# Patient Record
Sex: Female | Born: 1950 | Hispanic: No | Marital: Married
Health system: Northeastern US, Academic
[De-identification: ages and names within clinical notes are randomized; demographics above are authoritative.]

---

## 2020-08-24 ENCOUNTER — Encounter: Admit: 2020-08-24 | Payer: PRIVATE HEALTH INSURANCE | Attending: Adult Health | Primary: Internal Medicine

## 2020-08-24 DIAGNOSIS — Z01818 Encounter for other preprocedural examination: Secondary | ICD-10-CM

## 2020-09-04 ENCOUNTER — Inpatient Hospital Stay: Admit: 2020-09-04 | Discharge: 2020-09-04 | Payer: MEDICARE | Primary: Internal Medicine

## 2020-09-04 DIAGNOSIS — Z01818 Encounter for other preprocedural examination: Secondary | ICD-10-CM

## 2020-09-05 DIAGNOSIS — Z20822 Contact with and (suspected) exposure to covid-19: Secondary | ICD-10-CM

## 2020-09-05 DIAGNOSIS — Z01812 Encounter for preprocedural laboratory examination: Secondary | ICD-10-CM

## 2020-09-05 LAB — COVID-19 CLEARANCE OR FOR PLACEMENT ONLY: BKR SARS-COV-2 RNA (COVID-19) (YH): NEGATIVE

## 2020-09-07 ENCOUNTER — Ambulatory Visit: Admit: 2020-09-07 | Payer: PRIVATE HEALTH INSURANCE | Attending: Anesthesiology | Primary: Internal Medicine

## 2020-09-07 ENCOUNTER — Encounter: Admit: 2020-09-07 | Payer: PRIVATE HEALTH INSURANCE | Attending: Gastroenterology | Primary: Internal Medicine

## 2020-09-07 ENCOUNTER — Inpatient Hospital Stay: Admit: 2020-09-07 | Discharge: 2020-09-07 | Payer: MEDICARE | Primary: Internal Medicine

## 2020-09-07 DIAGNOSIS — Z9109 Other allergy status, other than to drugs and biological substances: Secondary | ICD-10-CM

## 2020-09-07 DIAGNOSIS — E892 Postprocedural hypoparathyroidism: Secondary | ICD-10-CM

## 2020-09-07 DIAGNOSIS — Z87891 Personal history of nicotine dependence: Secondary | ICD-10-CM

## 2020-09-07 DIAGNOSIS — K219 Gastro-esophageal reflux disease without esophagitis: Secondary | ICD-10-CM

## 2020-09-07 DIAGNOSIS — Z885 Allergy status to narcotic agent status: Secondary | ICD-10-CM

## 2020-09-07 DIAGNOSIS — Z880 Allergy status to penicillin: Secondary | ICD-10-CM

## 2020-09-07 DIAGNOSIS — Z79890 Hormone replacement therapy (postmenopausal): Secondary | ICD-10-CM

## 2020-09-07 DIAGNOSIS — Z882 Allergy status to sulfonamides status: Secondary | ICD-10-CM

## 2020-09-07 DIAGNOSIS — Z79899 Other long term (current) drug therapy: Secondary | ICD-10-CM

## 2020-09-07 DIAGNOSIS — K5900 Constipation, unspecified: Secondary | ICD-10-CM

## 2020-09-07 DIAGNOSIS — K6389 Other specified diseases of intestine: Secondary | ICD-10-CM

## 2020-09-07 DIAGNOSIS — K648 Other hemorrhoids: Secondary | ICD-10-CM

## 2020-09-07 DIAGNOSIS — Z9889 Other specified postprocedural states: Secondary | ICD-10-CM

## 2020-09-07 DIAGNOSIS — J302 Other seasonal allergic rhinitis: Secondary | ICD-10-CM

## 2020-09-07 DIAGNOSIS — M8580 Other specified disorders of bone density and structure, unspecified site: Secondary | ICD-10-CM

## 2020-09-07 DIAGNOSIS — F419 Anxiety disorder, unspecified: Secondary | ICD-10-CM

## 2020-09-07 DIAGNOSIS — E039 Hypothyroidism, unspecified: Secondary | ICD-10-CM

## 2020-09-07 DIAGNOSIS — Z881 Allergy status to other antibiotic agents status: Secondary | ICD-10-CM

## 2020-09-07 DIAGNOSIS — E210 Primary hyperparathyroidism: Secondary | ICD-10-CM

## 2020-09-07 MED ORDER — LEVOTHYROXINE 75 MCG TABLET
75 MCG | Freq: Every day | ORAL | Status: AC
Start: 2020-09-07 — End: ?

## 2020-09-07 MED ORDER — SODIUM CHLORIDE 0.9 % (FLUSH) INJECTION SYRINGE
0.9 % | Freq: Three times a day (TID) | INTRAVENOUS | Status: DC
Start: 2020-09-07 — End: 2020-09-07

## 2020-09-07 MED ORDER — SODIUM CHLORIDE 0.9 % (FLUSH) INJECTION SYRINGE
0.9 % | Status: DC | PRN
Start: 2020-09-07 — End: 2020-09-07

## 2020-09-07 MED ORDER — LACTATED RINGERS INTRAVENOUS SOLUTION
INTRAVENOUS | Status: DC
Start: 2020-09-07 — End: 2020-09-07
  Administered 2020-09-07: 13:00:00 1000.000 mL/h via INTRAVENOUS

## 2020-09-07 MED ORDER — DULOXETINE 30 MG CAPSULE,DELAYED RELEASE
30 mg | Freq: Every day | ORAL | Status: AC
Start: 2020-09-07 — End: ?

## 2020-09-07 MED ORDER — LACTATED RINGERS INTRAVENOUS SOLUTION
INTRAVENOUS | Status: DC | PRN
Start: 2020-09-07 — End: 2020-09-07
  Administered 2020-09-07: 13:00:00 via INTRAVENOUS

## 2020-09-07 MED ORDER — PROPOFOL 10 MG/ML INTRAVENOUS EMULSION
10 mg/mL | INTRAVENOUS | Status: DC | PRN
Start: 2020-09-07 — End: 2020-09-07
  Administered 2020-09-07 (×2): 10 mg/mL via INTRAVENOUS

## 2020-09-07 MED ORDER — GLYCOPYRROLATE 0.2 MG/ML INJECTION SOLUTION
0.2 mg/mL | INTRAVENOUS | Status: DC | PRN
Start: 2020-09-07 — End: 2020-09-07
  Administered 2020-09-07: 13:00:00 0.2 mg/mL via INTRAVENOUS

## 2020-09-07 MED ORDER — PROPOFOL 10 MG/ML INTRAVENOUS EMULSION
10 mg/mL | INTRAVENOUS | Status: DC | PRN
Start: 2020-09-07 — End: 2020-09-07
  Administered 2020-09-07: 13:00:00 10 mL/h via INTRAVENOUS

## 2020-09-07 MED ORDER — SODIUM CHLORIDE 0.9 % (FLUSH) INJECTION SYRINGE
0.9 % | INTRAVENOUS | Status: DC | PRN
Start: 2020-09-07 — End: 2020-09-07

## 2020-09-07 NOTE — Other
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

## 2020-09-07 NOTE — H&P
[facility]	 [facility]	 Gastroenterology History & PhysicalHistory provided by: the patientHistory limited by: no limitationsSubjective: Chief Complaint:Change in bowel habitsMedical History: PMH PSH Past Medical History: Diagnosis Date ? Anxiety  ? H/O colonoscopy 02/22/2017  5 yr recall ? Hypothyroidism  ? Osteopenia  ? Primary hyperparathyroidism (HC Code) (HC CODE)  ? Seasonal allergies   Past Surgical History: Procedure Laterality Date ? History of adenoidectomy   ? History of hernia repair   ? History of thyroid surgery   ? History of tonsillectomy   ? HYSTERECTOMY  2008  Dr. Rosanne Sack for fibroids, b/l oophrectomy as well ? PARATHYROID GLAND SURGERY  2001  Dr. Hessie Diener, [place]  Social History Family History Social History Tobacco Use ? Smoking status: Former Smoker   Packs/day: 1.00   Years: 30.00   Pack years: 30.00   Quit date: 2008   Years since quitting: 13.9 ? Smokeless tobacco: Never Used Substance Use Topics ? Alcohol use: Yes   Comment: social  Family History Problem Relation Age of Onset ? Thyroid cancer Mother  ? Osteoporosis Mother  ? GER disease Mother  ? Ulcers Mother  ? Lung cancer Mother  ? Coronary Artery Disease Father  ? Heart failure Father  ? Kidney failure Father  ? AVM Sister   Prior to Admission Medications Medications Prior to Admission Medication Sig Dispense Refill Last Dose ? calcium-magnesium-zinc 333-133-5 mg Tab Take 333 mg by mouth daily.   Past Month at Unknown time ? levothyroxine (SYNTHROID, LEVOTHROID) 75 MCG tablet Take 75 mcg by mouth daily.    ? loratadine (CLARITIN) 10 mg tablet Take 10 mg by mouth daily.   09/06/2020 at Unknown time ? ascorbic acid, vitamin C, (VITAMIN C) 1000 MG tablet Take 1,000 mg by mouth daily..   Unknown at Unknown time ? azelastine (ASTELIN) 137 mcg (0.1 %) nasal spray 1 spray by Nasal route 2 (two) times daily.. Use in each nostril as directed 30 mL 12 Unknown at Unknown time ? azelastine-fluticasone (DYMISTA) 137-50 mcg/spray Spry nasal spray 1 spray by Each Nare route 2 (two) times daily.. (Patient not taking: Reported on 09/07/2020) 23 g 0 Not Taking at Unknown time ? b complex vitamins capsule Take 1 capsule by mouth daily.    ? cholecalciferol (VITAMIN D-3) 2,000 unit capsule Take by mouth. (Patient not taking: Reported on 09/07/2020)   Not Taking at Unknown time ? cyanocobalamin (VITAMIN B-12) 1000 MCG tablet Take 1,000 mcg by mouth daily.. (Patient not taking: Reported on 09/07/2020)   Not Taking at Unknown time ? DULoxetine (CYMBALTA) 30 mg capsule Take 30 mg by mouth daily.    ? fluticasone (FLONASE) 50 mcg/actuation nasal spray 1 spray by Nasal route daily.. (Patient not taking: Reported on 09/07/2020) 16 g 12 Not Taking at Unknown time ? mupirocin (BACTROBAN) 2 % ointment Apply to both sides nose twice a day (Patient not taking: Reported on 09/07/2020) 22 g 2 Not Taking at Unknown time ? tobramycin-dexamethasone (TOBRADEX) ophthalmic solution  (Patient not taking: Reported on 09/07/2020)   Not Taking at Unknown time ? [DISCONTINUED] citalopram (CELEXA) 20 MG tablet TAKE 1 TABLET BY MOUTH EVERY DAY (Patient not taking: Reported on 09/07/2020)  0 Not Taking at Unknown time ? [DISCONTINUED] NATURE-THROID 65 mg Tab Take 1 tablet by mouth daily. (Patient not taking: Reported on 09/07/2020) 30 tablet 3 Not Taking at Unknown time  Allergies Allergies Allergen Reactions ? Cephalosporins Hives and Rash ? Penicillins Hives and Itching ? Cats  ? Environmental Allergies  DUST ? Ketek  [Telithromycin]    DIZZY & WEAK ? Morphine Rash ? Sulfa (Sulfonamide Antibiotics) Itching  Review of Systems: Otherwise, she constitutional, head, eye, ENT, musculoskeletal, skin, endocrine, hematologic, allergy, and immunologic ROS are unremarkable.Objective: Vitals:Last 24 hours: Temp:  [97.8 ?F (36.6 ?C)] 97.8 ?F (36.6 ?C)Pulse:  [69] 69Resp:  [18] 18BP: (139)/(70) 139/70SpO2:  [98 %] 98 %Physical Exam: Appearance:   No distress, good color on room air. Skin  No distress, good color on room air. Alert and cooperative. HEENT  Normocephalic, PERRL, oropharynx clear, EOMs intact. Neck  No nodes, thyromegaly, JVD, or carotid bruits. Lungs  Clear to auscultation, breath sounds equal bilaterally. Heart  Rhythm regular. Normal S1 and S2. No murmurs, gallops, or rubs.  Abdomen  No organomegaly, masses, or tenderness. Extretmities  No cyanosis or edema. Pulses are full and equal.  Neuro  Mental status, strength, cranial nerves, reflexes, gait and speech are normal.  Labs: Last 24 hours: No results found for this or any previous visit (from the past 24 hour(s)).Assessment: Change in boewl habitsI have reviewed the patient's problem list and updated it as needed.Plan: ColoNotifications: Plan discussed with patient and/or family. YesSigned:Cinda Hara Cleaster Corin, MD Beeper: [id number] AM

## 2020-09-07 NOTE — Anesthesia Post-Procedure Evaluation
Anesthesia Post-op NotePatient: Emma L GentileProcedure(s):  Procedure(s):COLONOSCOPY, FLEXIBLE; W/BX, SINGLE/MULTIPLE\\RANDOM COLON BIOPSIES Patient location: PACULast Vitals:  I have noted the vital signs as listed in the nursing notes.Mental status recovered: patient participates in evaluation: YesVital signs reviewed: YesRespiratory function stable:YesAirway is patent: YesCardiovascular function and hydration status stable: YesPain control satisfactory: YesNausea and vomiting control satisfactory:YesNo complications documented.

## 2020-09-07 NOTE — Other
Post Anesthesia Transfer of Care NotePatient: Emma L GentileProcedure(s) Performed: Procedure(s):COLONOSCOPY, FLEXIBLE; W/BX, SINGLE/MULTIPLE\\RANDOM COLON BIOPSIES Patient location: PACU Last Vitals: Vitals Value Taken Time BP 145/68 09/07/20 0840 Temp 35.9 ?C 09/07/20 0840 Pulse 78 09/07/20 0843 Resp 18 09/07/20 0840 SpO2 99 % 09/07/20 0843 Vitals shown include unvalidated device data.Level of consciousness: sedatedTransport Vital Signs:  Stable since the last set of recorded intra-operative vital signsComplications: noneIntra-operative Intake & Output and Antibiotics as per Anesthesia record and discussed with the RN.

## 2020-09-07 NOTE — Anesthesia Pre-Procedure Evaluation
This is a 69 y.o. female scheduled for COLONOSCOPY (N/A ).Review of Systems/ Medical HistoryPatient summary, nursing notes, pre-procedure vitals, height, weight and NPO status reviewed.No previous anesthesia concernsAnesthesia Evaluation: Estimated body mass index is 30.23 kg/m? as calculated from the following:  Height as of this encounter: 5' 1 (1.549 m).  Weight as of this encounter: 72.6 kg. CC/HPI: Past Medical History:No date: Anxiety05/17/2018: H/O colonoscopy    Comment:  5 yr recallNo date: HypothyroidismNo date: OsteopeniaNo date: Primary hyperparathyroidism (HC Code) (HC CODE)No date: Seasonal allergiesPast Surgical History:  Past Surgical History:No date: History of adenoidectomyNo date: History of hernia repairNo date: History of thyroid surgeryNo date: History of tonsillectomy2008: HYSTERECTOMY    Comment:  Dr. Rosanne Sack for fibroids, b/l oophrectomy as well2001: PARATHYROID GLAND SURGERY    Comment:  [facility]Exercise tolerance: >4 METS Respiratory:  Negative.Neuromuscular: NegativeGastrointestinal/Genitourinary: -Gastrointestinal Disorders:  Patient has GERD. Her GERD is well controlled.Endocrine/Metabolic: -Thyroid Disorders:  Patient has hypothyroidism.Physical ExamCardiovascular:    normal exam  Rhythm: regularHeart Sounds: S1 present and S2 present.Pulmonary:  normal exam  Patient's breath sounds clear to auscultationAirway:  Mallampati: IITM distance: >3 FBNeck ROM: fullDental:  normal exam  Other Findings: Plastic retainer permAnesthesia PlanASA 2 The primary anesthesia plan is  general. Anesthesia informed consent obtained. Consent obtained from: patientThe post operative pain plan is IV analgesics.Plan discussed with CRNA.Anesthesiologist's Pre Op NoteI personally evaluated and examined the patient prior to the intra-operative phase of care.

## 2020-09-07 NOTE — Discharge Instructions
Patient Education Patient Education Propofol injectionWhat is this medicine?PROPOFOL (proe POE fol) is an anesthetic. It is used to produce relaxation and sleep before or during surgery. It is also used in patients on a ventilator.This medicine may be used for other purposes; ask your health care provider or pharmacist if you have questions.COMMON BRAND NAME(S): Diprivan, [employer] PropovenWhat should I tell my health care provider before I take this medicine?They need to know if you have any of these conditions:?	head injury?	heart disease?	high cholesterol?	history of pancreatitis?	kidney disease?	seizures?	an unusual or allergic reaction to propofol, anesthetics, eggs, soy, peanuts, sulfites, other medicines, foods, dyes, or preservatives?	pregnant or trying to get pregnant?	breast-feedingHow should I use this medicine?This medicine is for infusion into a vein. It is given by a health care professional in a hospital or clinic setting.Talk to your pediatrician regarding the use of this medicine in children. While this drug may be prescribed for children as young as 2 months for selected conditions, precautions do apply.Overdosage: If you think you have taken too much of this medicine contact a poison control center or emergency room at once.NOTE: This medicine is only for you. Do not share this medicine with others.What if I miss a dose?This does not apply. This medicine is not for regular use.What may interact with this medicine?Do not take this medicine with any of the following medications:?	MAOIs like Carbex, Eldepryl, Marplan, Nardil, and ParnateThis medicine may also interact with the following medications:?	certain medicines for anxiety or sleep?	narcotic medicines for pain?	valproic acidThis list may not describe all possible interactions. Give your health care provider a list of all the medicines, herbs, non-prescription drugs, or dietary supplements you use. Also tell them if you smoke, drink alcohol, or use illegal drugs. Some items may interact with your medicine.What should I watch for while using this medicine?Your condition will be monitored carefully while you are receiving this medicine.You may get drowsy or dizzy. Do not drive, use machinery, or do anything that needs mental alertness until you know how this medicine affects you. Do not stand or sit up quickly, especially if you are an older patient. This reduces the risk of dizzy or fainting spells. Alcohol may interfere with the effect of this medicine. Avoid alcoholic drinks.You may need blood work done while you are taking this medicine.You should make sure you get enough zinc while you are taking this medicine. Discuss the foods you eat and the vitamins you take with your health care professional.What side effects may I notice from receiving this medicine?Side effects that you should report to your doctor or health care professional as soon as possible:?	allergic reactions like skin rash, itching or hives, swelling of the face, lips, or tongue?	breathing problems?	signs and symptoms of increased acid in the body like breathing fast; fast heartbeat; headache; confusion; unusually weak or tired; nausea, vomiting?	signs and symptoms of infection like fever; chills; cough; sore throat; pain or trouble passing urine?	signs and symptoms of low blood pressure like dizziness; feeling faint or lightheaded, falls; unusually weak or tired?	uncontrollable head, mouth, neck, arm, or leg movementsSide effects that usually do not require medical attention (report to your doctor or health care professional if they continue or are bothersome):?	changes in vision?	drowsiness?	pain, redness or irritation at site where injectedThis list may not describe all possible side effects. Call your doctor for medical advice about side effects. You may report side effects to FDA at 1-800-FDA-1088.Where should I keep my medicine?This drug is given in a hospital or clinic and will not be stored at home.NOTE: This sheet is a summary. It may  not cover all possible information. If you have questions about this medicine, talk to your doctor, pharmacist, or health care provider.? 2021 Elsevier/Gold Standard (2019-03-18 08:47:52) Colonoscopy, Adult, Care AfterThis sheet gives you information about how to care for yourself after your procedure. Your doctor may also give you more specific instructions. If you have problems or questions, call your doctor.What can I expect after the procedure?After the procedure, it is common to have:?	A small amount of blood in your poop (stool) for 24 hours.?	Some gas.?	Mild cramping or bloating in your belly (abdomen).Follow these instructions at home:Eating and drinking?	Drink enough fluid to keep your pee (urine) pale yellow.?	Follow instructions from your doctor about what you cannot eat or drink.?	Return to your normal diet as told by your doctor. Avoid heavy or fried foods that are hard to digest.  Activity?	Rest as told by your doctor.?	Do not sit for a long time without moving. Get up to take short walks every 1-2 hours. This is important. Ask for help if you feel weak or unsteady.?	Return to your normal activities as told by your doctor. Ask your doctor what activities are safe for you.To help cramping and bloating:?	Try walking around.?	Put heat on your belly as told by your doctor. Use the heat source that your doctor recommends, such as a moist heat pack or a heating pad.?	Put a towel between your skin and the heat source.?	Leave the heat on for 20-30 minutes.?	Remove the heat if your skin turns bright red. This is very important if you are unable to feel pain, heat, or cold. You may have a greater risk of getting burned.  General instructions?	If you were given a medicine to help you relax (sedative) during your procedure, it can affect you for many hours. Do not drive or use machinery until your doctor says that it is safe.?	For the first 24 hours after the procedure:?	Do not sign important documents.?	Do not drink alcohol.?	Do your daily activities more slowly than normal.?	Eat foods that are soft and easy to digest.?	Take over-the-counter or prescription medicines only as told by your doctor.?	Keep all follow-up visits as told by your doctor. This is important.Contact a doctor if:?	You have blood in your poop 2-3 days after the procedure.Get help right away if:?	You have more than a small amount of blood in your poop.?	You see large clumps of tissue (blood clots) in your poop.?	Your belly is swollen.?	You feel like you may vomit (nauseous).?	You vomit.?	You have a fever.?	You have belly pain that gets worse, and medicine does not help your pain.Summary?	After the procedure, it is common to have a small amount of blood in your poop. You may also have mild cramping and bloating in your belly.?	If you were given a medicine to help you relax (sedative) during your procedure, it can affect you for many hours. Do not drive or use machinery until your doctor says that it is safe.?	Get help right away if you have a lot of blood in your poop, feel like you may vomit, have a fever, or have more belly pain.This information is not intended to replace advice given to you by your health care provider. Make sure you discuss any questions you have with your health care provider.Document Revised: 08/01/2019 Document Reviewed: 07/13/2020Elsevier Patient Education ? 2021 Elsevier Inc.

## 2020-09-07 NOTE — Other
Patient tolerated procedure with no difficulties. Dr. Lillo spoke with patient and provided an explanation of findings from procedure. Patient provided with education and discharge instructions, verbalized understanding. No further questions at this time. Continue with plan of care.

## 2020-09-08 ENCOUNTER — Encounter: Admit: 2020-09-08 | Payer: PRIVATE HEALTH INSURANCE | Attending: Gastroenterology | Primary: Internal Medicine

## 2020-09-08 DIAGNOSIS — E210 Primary hyperparathyroidism: Secondary | ICD-10-CM

## 2020-09-08 DIAGNOSIS — Z9889 Other specified postprocedural states: Secondary | ICD-10-CM

## 2020-09-08 DIAGNOSIS — M8580 Other specified disorders of bone density and structure, unspecified site: Secondary | ICD-10-CM

## 2020-09-08 DIAGNOSIS — F419 Anxiety disorder, unspecified: Secondary | ICD-10-CM

## 2020-09-08 DIAGNOSIS — J302 Other seasonal allergic rhinitis: Secondary | ICD-10-CM

## 2020-09-08 DIAGNOSIS — E039 Hypothyroidism, unspecified: Secondary | ICD-10-CM

## 2021-05-12 IMAGING — MR MRI LUMBAR SPINE WITHOUT CONTRAST
4 of 7 series · 15 of 48 positions shown · IV contrast (gadolinium)
Comparison: None

FINAL Diagnostic Imaging Report 
Referring: VALCOURT, BILL                    
________________________________________________________________________________________________ 
MRI LUMBAR SPINE WITHOUT CONTRAST, 05/12/2021 [DATE]: 
CLINICAL INDICATION: Back pain
TECHNIQUE: Multiplanar, multiecho position MR images of the lumbar spine were 
performed without intravenous gadolinium enhancement. Patient was scanned on a 
3T magnet.

[Series 201: survey · axial · 10.0mm · 1.39mm/px · z∈[+40,+253]mm · 2 of 9 slices shown]
[im 1/9]
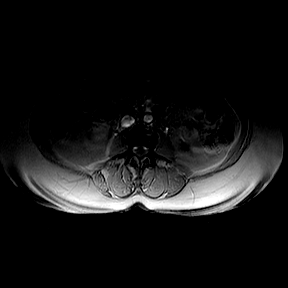
[im 9/9]
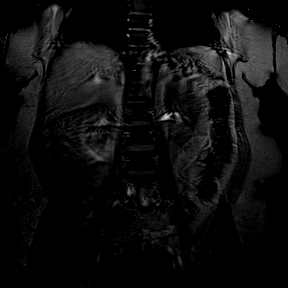

[Series 301: t2w_cor-surv · coronal · 6.0mm · 0.50mm/px · 1 of 5 slices shown]
[im 1/5]
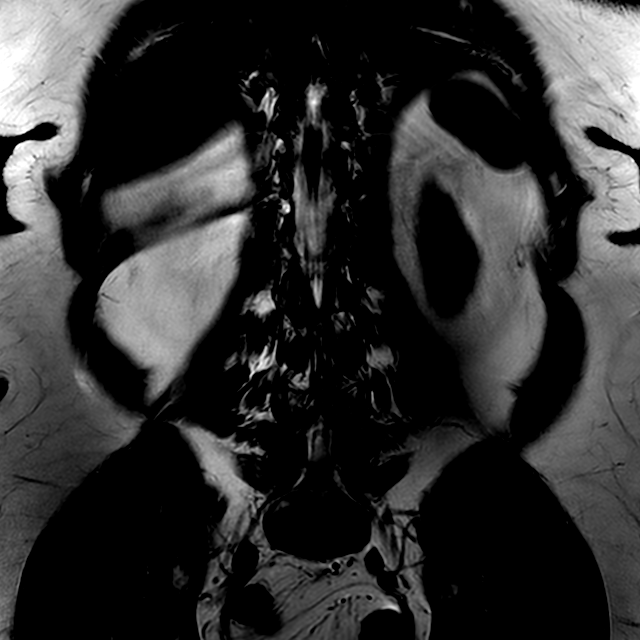

[Series 401: t2w_tse sag · sagittal · 4.0mm · 0.31mm/px · 3 of 17 slices shown]
[im 3/17]
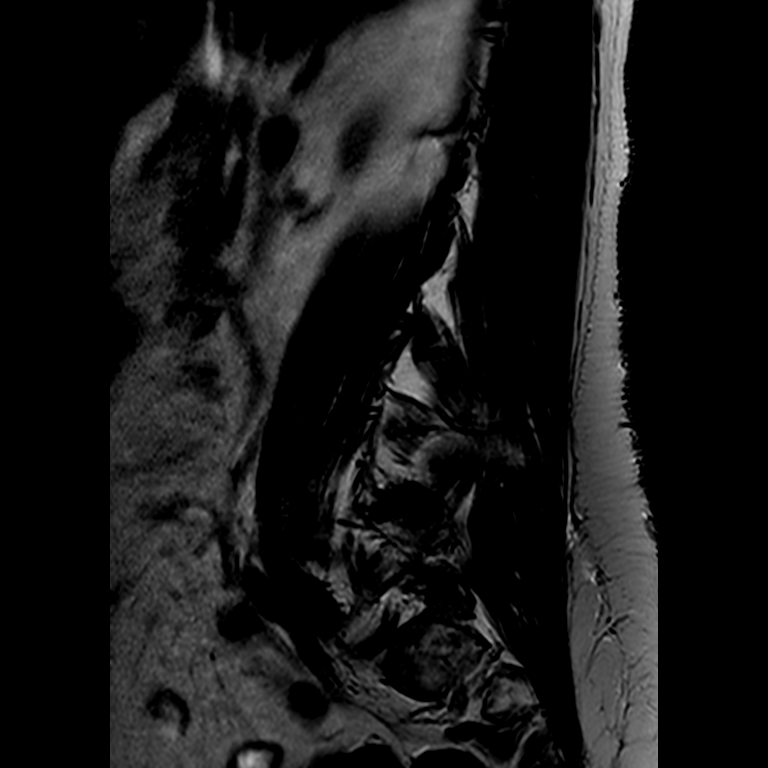
[im 9/17]
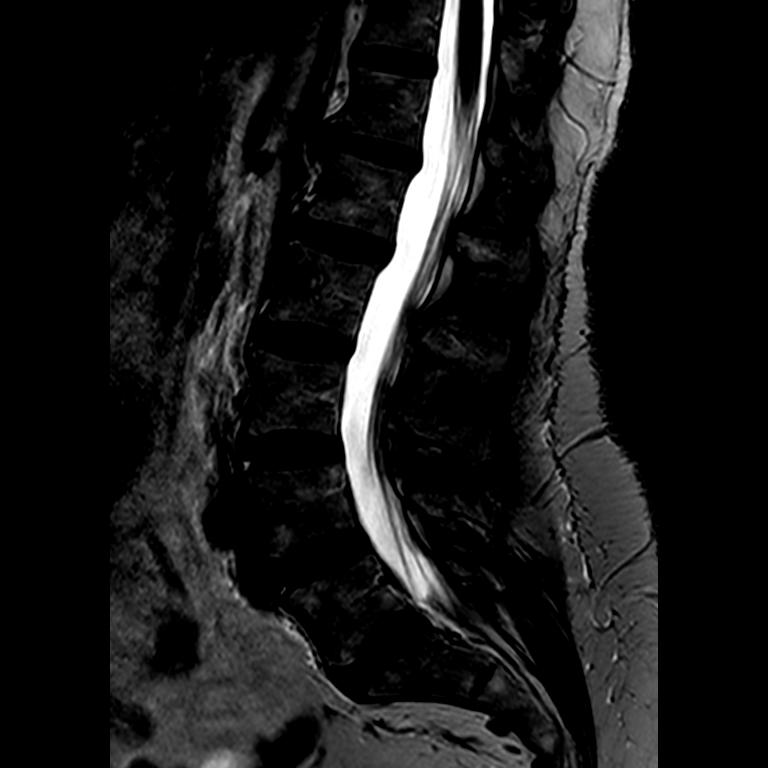
[im 14/17]
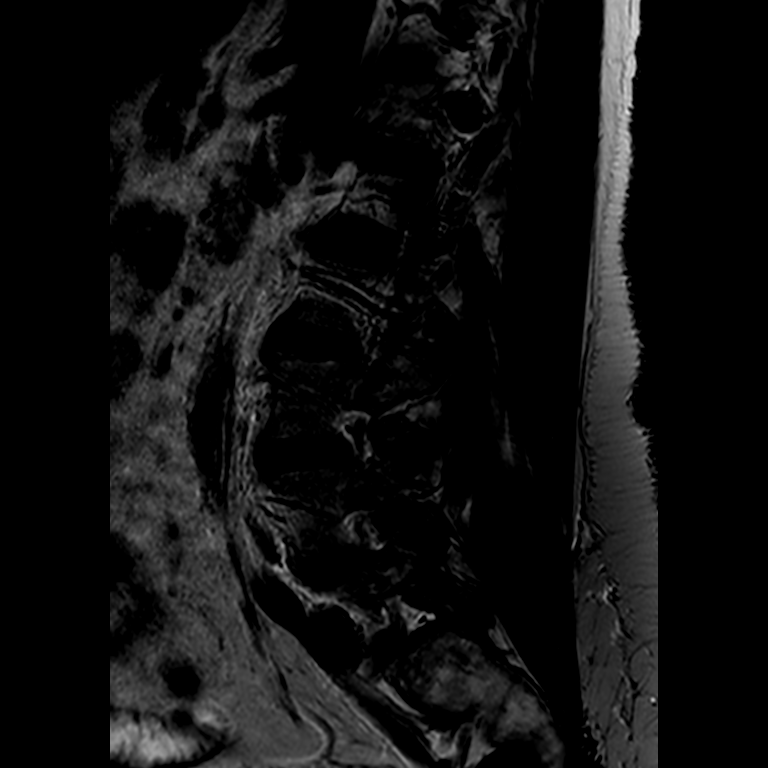

[Series 801: T1 · axial · 4.0mm · 0.35mm/px · z∈[-99,+105]mm · 9 of 30 slices shown]
[im 1/30]
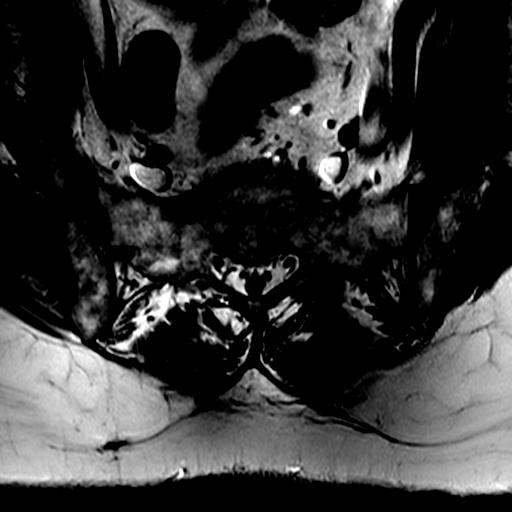
[im 6/30]
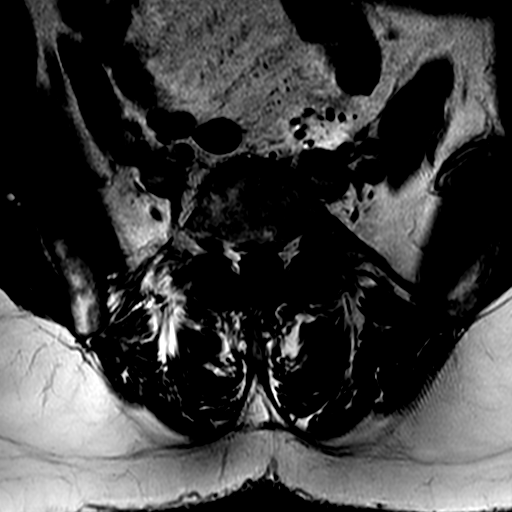
[im 8/30]
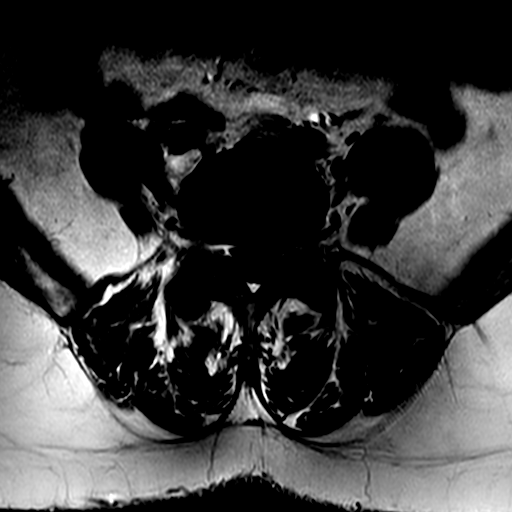
[im 14/30]
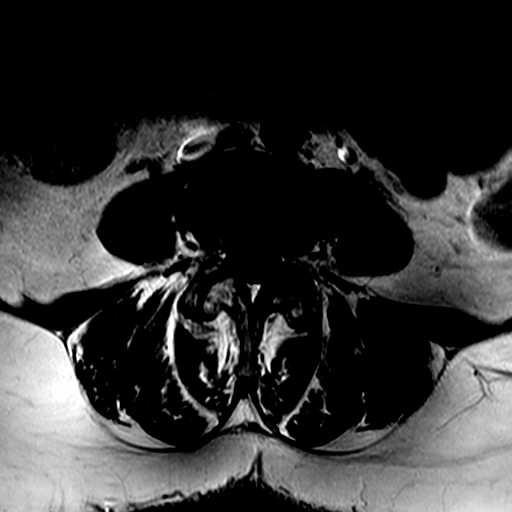
[im 16/30]
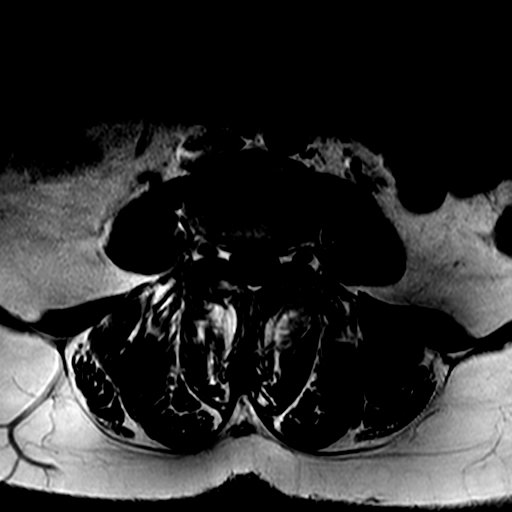
[im 22/30]
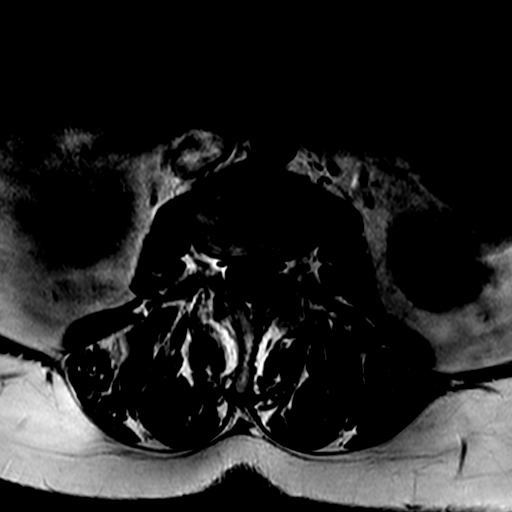
[im 24/30]
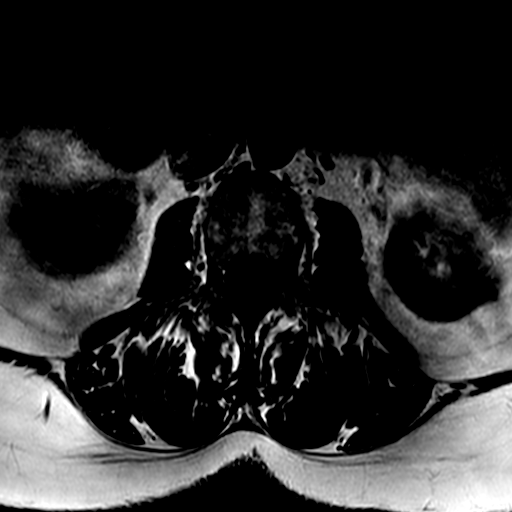
[im 27/30]
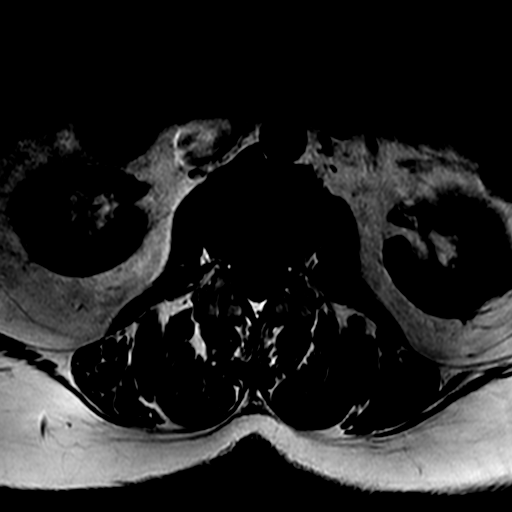
[im 30/30]
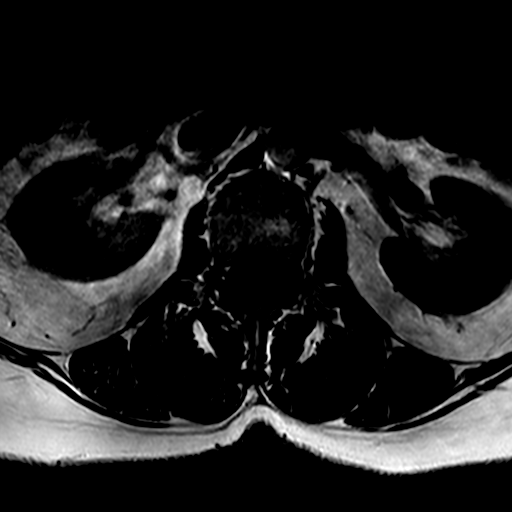

[15 of 48 positions shown; findings below may reference images not displayed]

FINDINGS: Todays study is mildly limited by motion artifact. Lumbar vertebral 
heights are intact. There is no evidence for fracture or malignancy. There is 
mild Modic type I change along the anteroinferior T12 endplate. Disc heights are 
preserved. The conus is normal, terminating opposite T11-12. 
At L5-S1 the canal is open. The disc margin is intact. Foramina are open. There 
are mild to moderate facet changes. 
At L4-5 there is moderate to marked facet change, worse on the right. The disc 
margin is intact and the canal and foramina are open. 
At L3-4 the canal and foramina are open and the disc margin is intact. There are 
moderate facet changes. 
At L2-3 and L1-2 the canal and foramina are open. There is mild disc bulge at 
L1-2, without neural impingement.
IMPRESSION: Facet degenerative changes throughout the lumbar spine, most pronounced at L4-5. 
No evidence for significant stenosis or disc contrast abnormality. 
No findings to indicate fracture or malignancy. Mild Modic type I changes along 
the anteroinferior T12 endplate.

## 2021-05-12 IMAGING — MG MAMMOGRAPHY SCREENING BILATERAL 3[PERSON_NAME]
8 series · 8 of 24 positions shown · non-contrast
Comparison: Comparison was made to prior examinations.

FINAL Diagnostic Imaging Report 
Referring: AMAZIGH, QUIRIJN                    
________________________________________________________________________________________________ 
MAMMOGRAPHY SCREENING BILATERAL 3JENE RAQUEL, 05/12/2021 [DATE]: 
CLINICAL INDICATION: Screening.
TECHNIQUE: Digital bilateral mammograms and 3-D Tomosynthesis were obtained. 
These were interpreted both primarily and with the aid of computer-aided 
detection system.  
BREAST DENSITY: (Level A) The breasts are almost entirely fatty.

[L MLO]
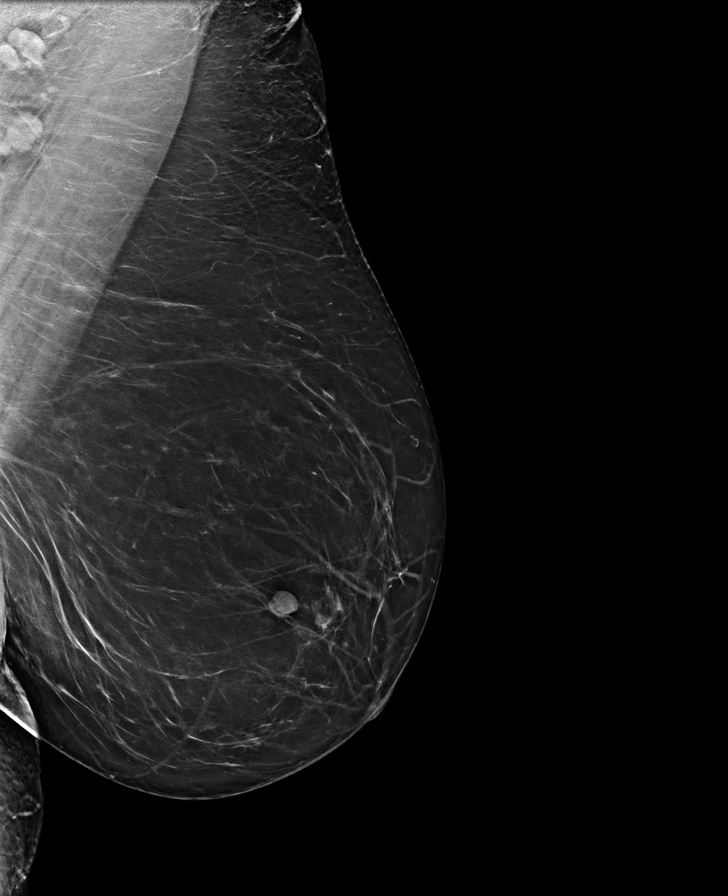

[L CC]
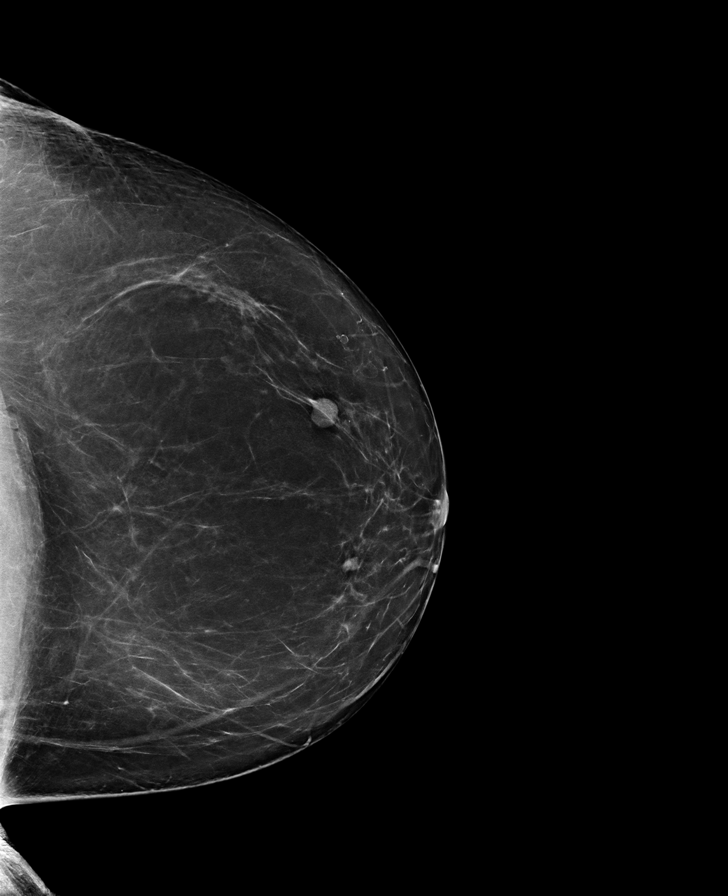

[R MLO]
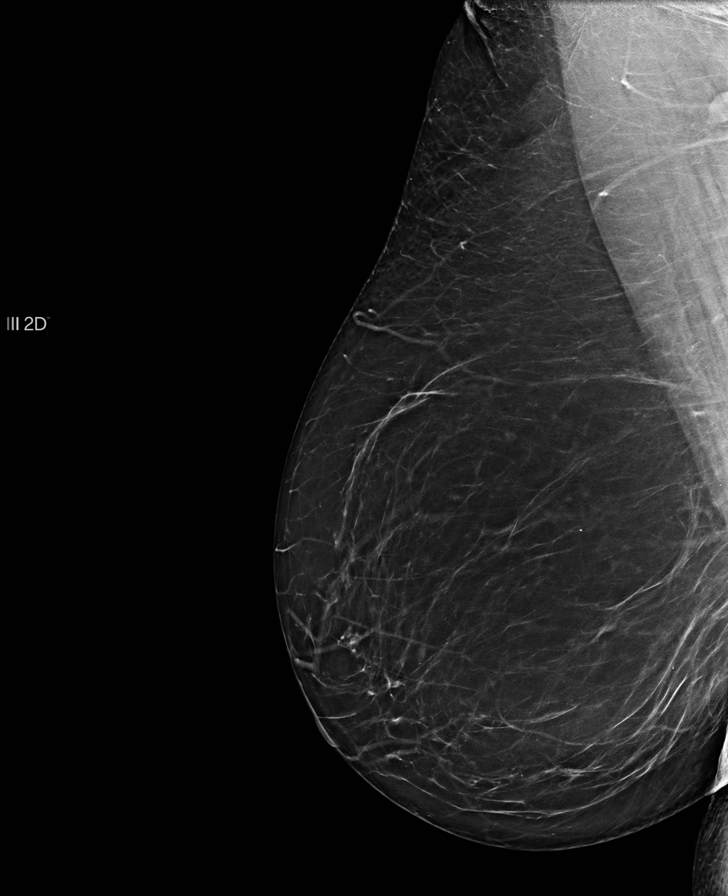

[R CC]
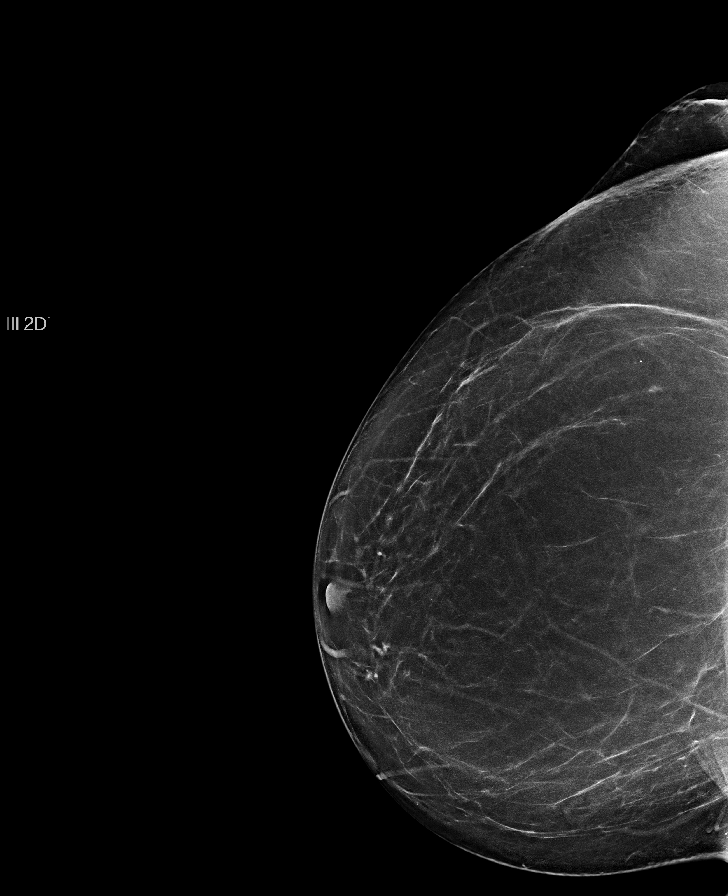

[R CC tomo · tomo slice 46/91.0]
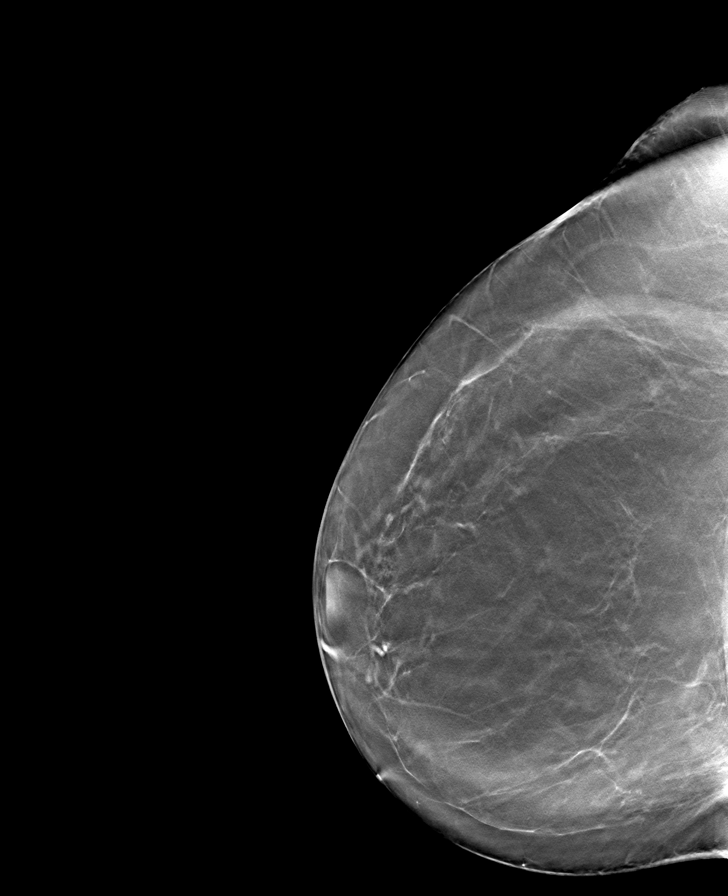

[L CC tomo · tomo slice 43/86.0]
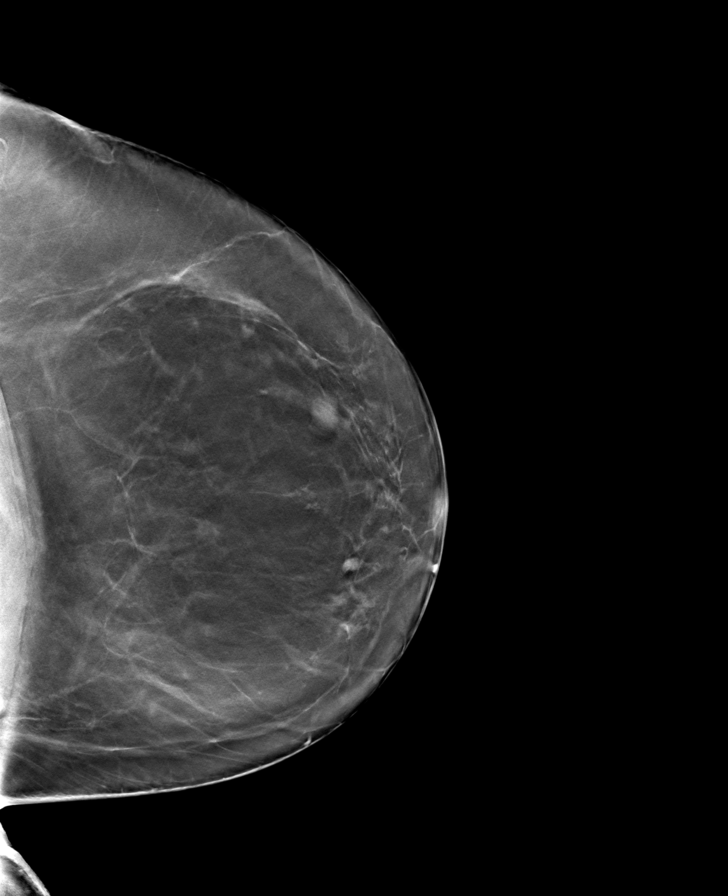

[R MLO tomo · tomo slice 47/92.0]
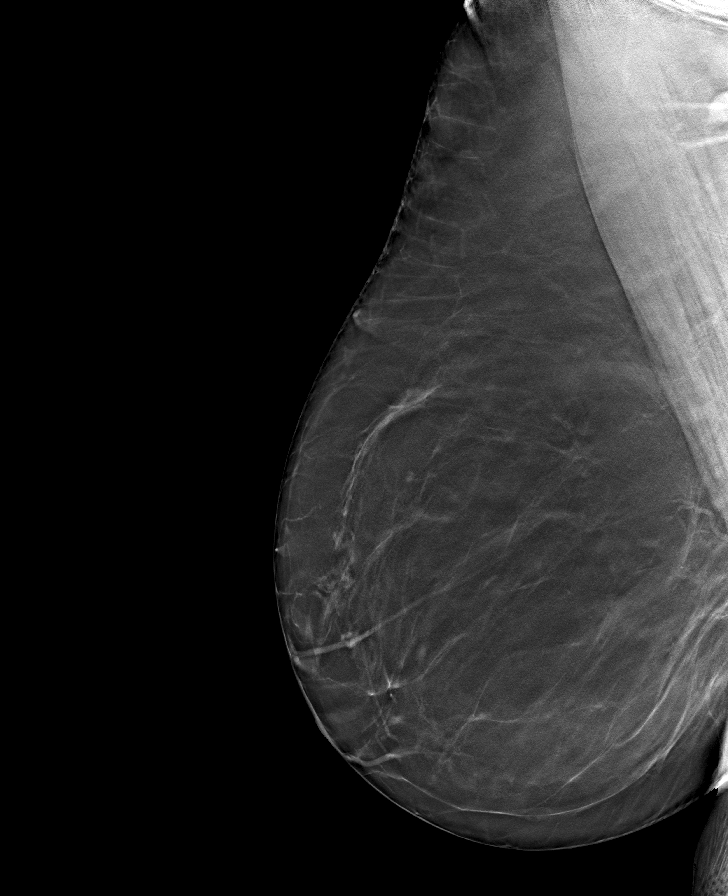

[L MLO tomo · tomo slice 45/90.0]
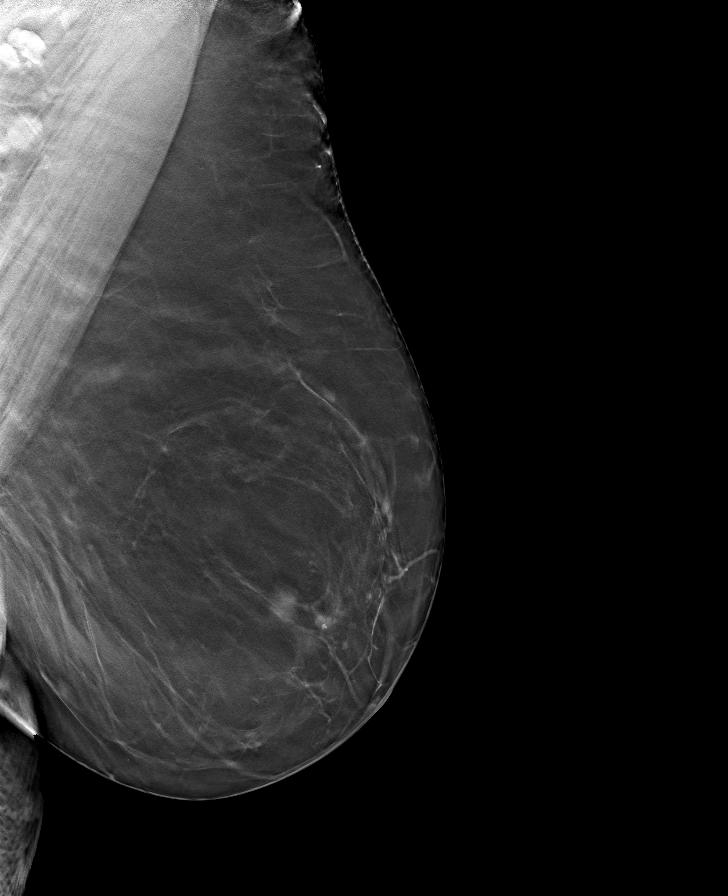

[8 of 24 positions shown; findings below may reference images not displayed]

FINDINGS: The presumed cyst laterally within the left breast waxes and wanes over 
time. There is however a developing density medial aspect of the left breast. 
This requires further evaluation with spot views and probable sonography. No 
suspicious abnormalities on the right.
IMPRESSION: Recommend additional views and probable sonography of the left breast. 
(BI-RADS 0) Incomplete. Further evaluation will be performed as discussed above 
and the results will be reported separately.

## 2021-12-09 IMAGING — DX RIBS LEFT 2 VIEW
1 series · 2 of 2 positions shown · non-contrast
Comparison: None.

________________________________________________________________________________________________ 
RIBS LEFT 2 VIEW, 12/09/2021 [DATE]: 
CLINICAL INDICATION: Pleurodynia. Injury in April 2021 resulting in rib fracture.

[Series 1: AP · U · 0.14mm/px · 2 of 2 slices shown]
[im 1/2]
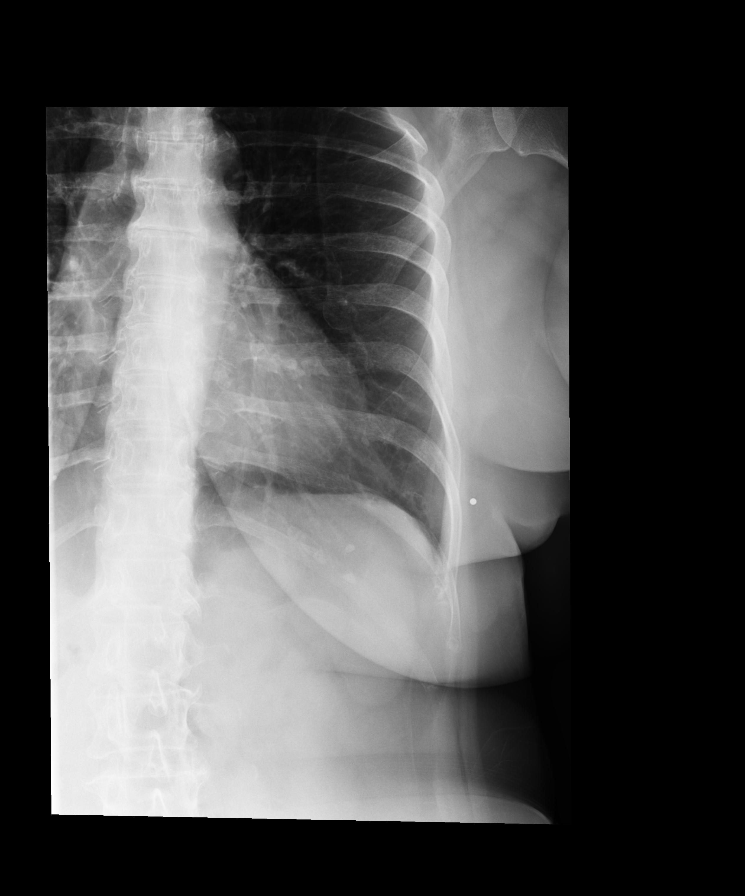
[im 2/2]
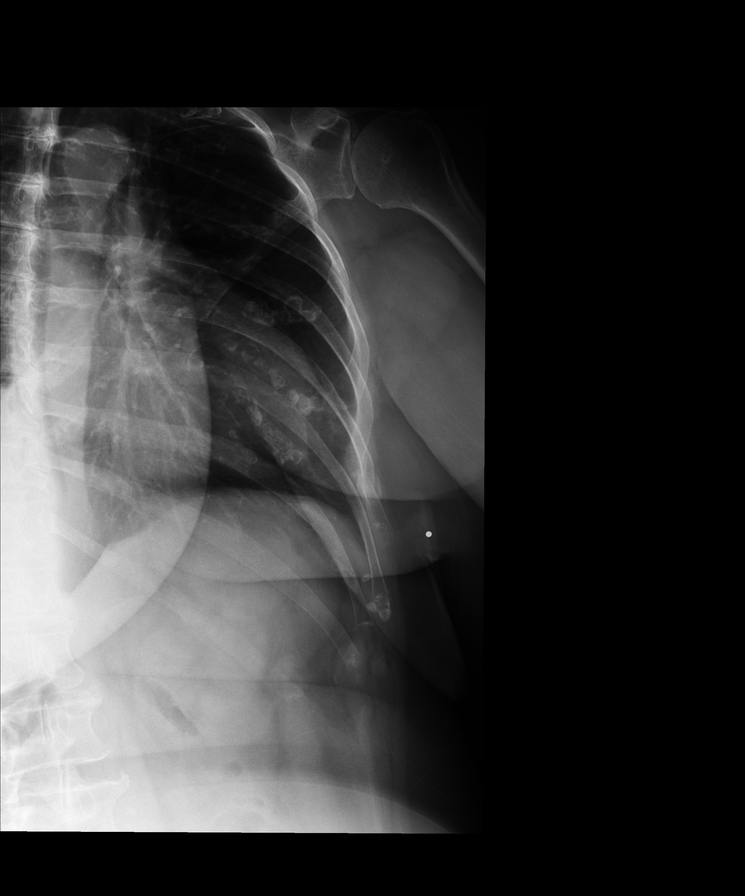

[2 of 2 positions shown; findings below may reference images not displayed]

FINDINGS: Nonacute, nondisplaced, healing left anterolateral seventh rib 
fracture with surrounding callus. No pneumothorax. Lungs are clear. 
Cardiomediastinal silhouette is within normal limits. No pleural effusions. 
Degenerative change of the spine.
IMPRESSION: Nonacute, nondisplaced, healing left anterolateral seventh rib fracture with 
surrounding callus.

## 2022-07-05 IMAGING — DX CHEST PA AND LATERAL
1 series · 2 of 2 positions shown · non-contrast
Comparison: Rib detail images 12/09/2021

________________________________________________________________________________________________ 
CLINICAL INDICATION: Cough, wheeze, congestion, fatigue, history of Covid one 
month ago, previous smoker

[Series 1: PA · U · 0.14mm/px · 2 of 2 slices shown]
[im 1/2]
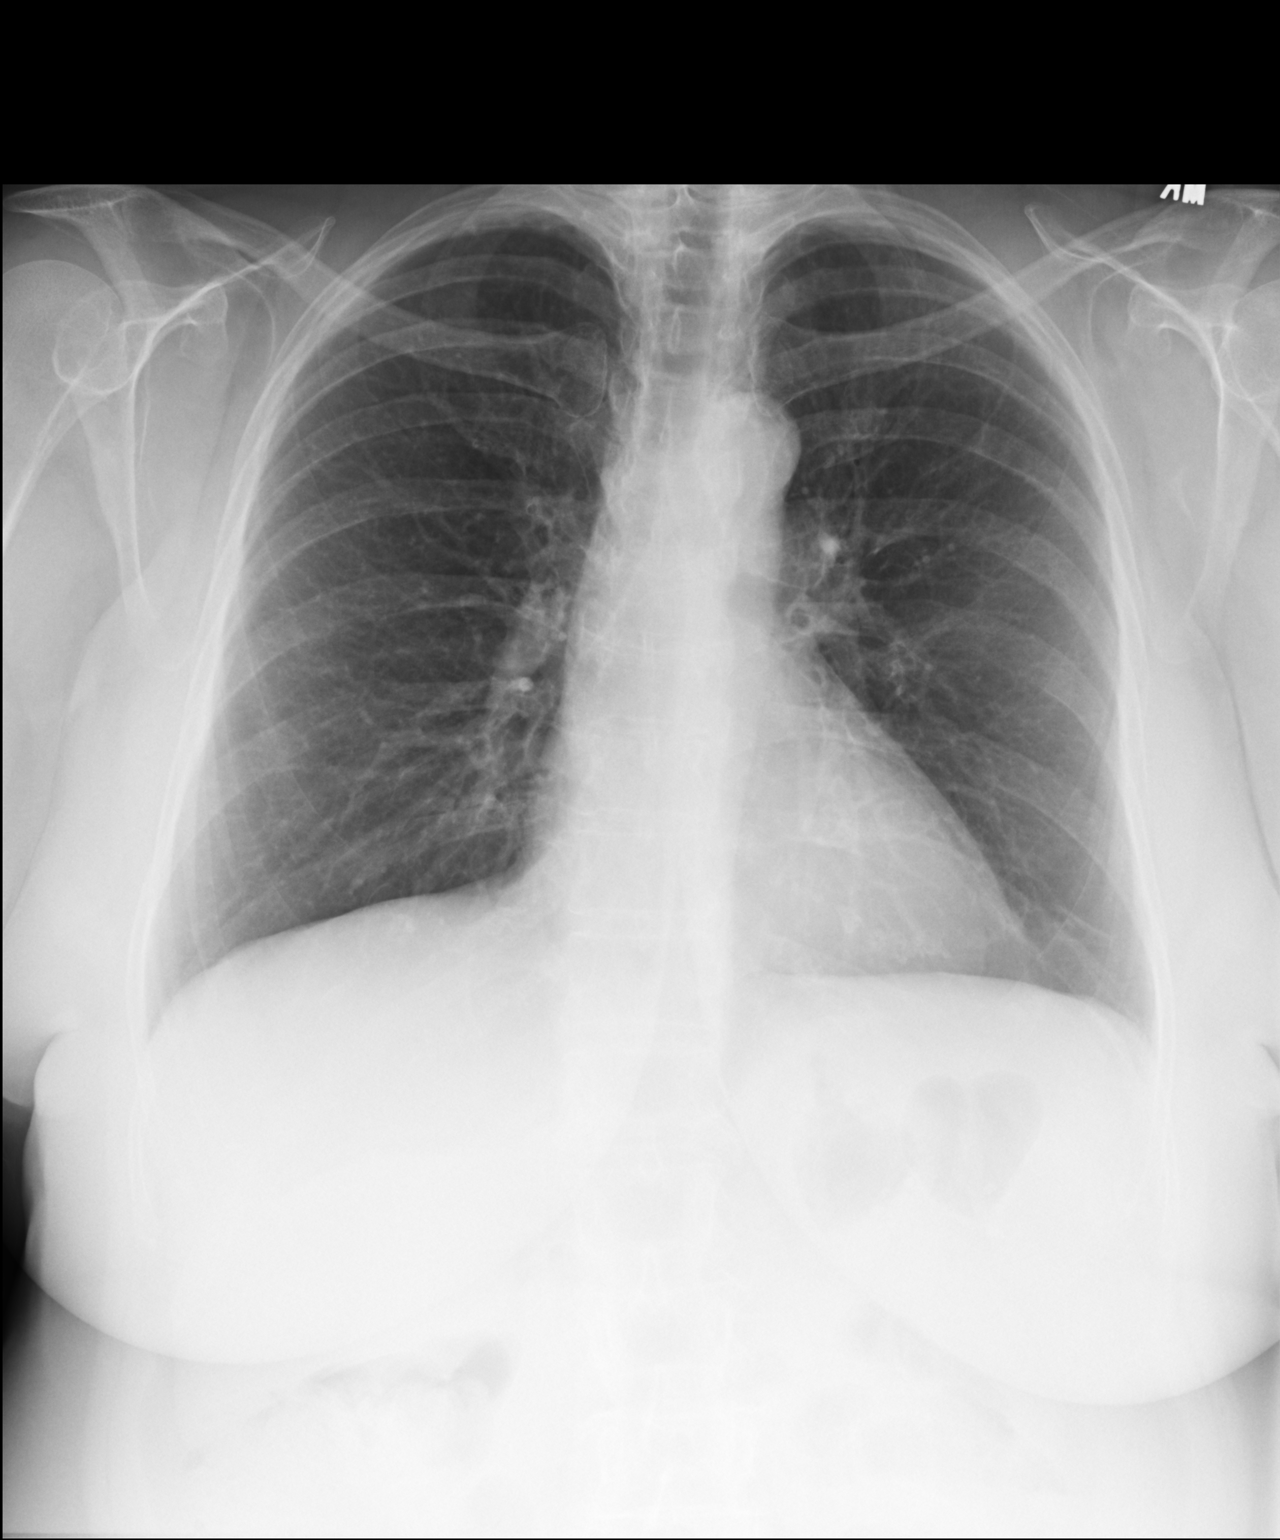
[im 2/2]
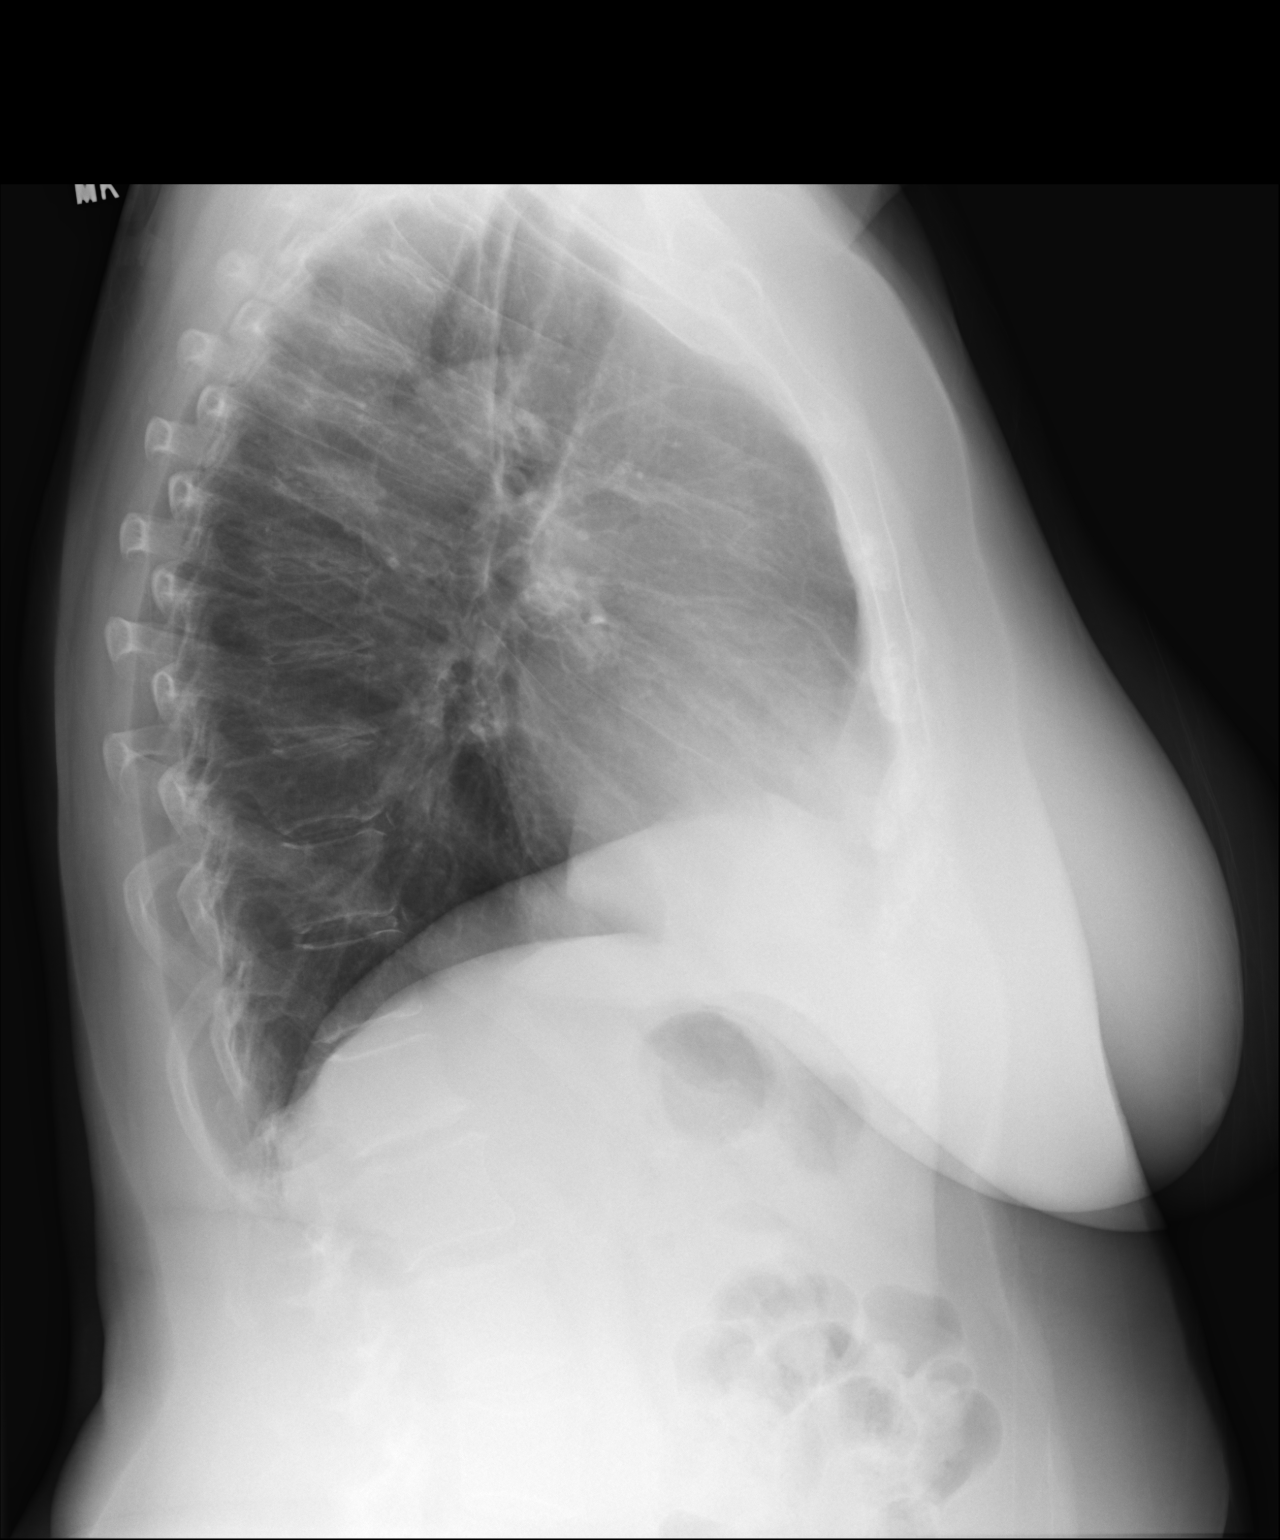

[2 of 2 positions shown; findings below may reference images not displayed]

FINDINGS: Normal heart size. Bilateral perihilar bronchial wall thickening. 
Increased retrosternal clear space. No pleural effusion or pneumothorax. No 
focal infiltrate. Mild degenerative thoracic spondylosis.
IMPRESSION: Bronchitis. No infiltrate. 
Mild emphysema likely.

## 2022-09-13 IMAGING — MG MAMMOGRAPHY SCREENING BILATERAL 3[PERSON_NAME]
8 series · 8 of 24 positions shown · non-contrast
Comparison: Comparison was made to prior examinations.

________________________________________________________________________________________________ 
MAMMOGRAPHY SCREENING BILATERAL 3AUJLA, DANII BREAST ULTRASOUND UNILATERAL 
LIMITED, 09/13/2022 [DATE]: 
CLINICAL INDICATION: Encounter for screening mammogram. As well as follow-up 
medial left breast.
TECHNIQUE: Digital bilateral mammograms and 3-D Tomosynthesis were obtained. 
These were interpreted both primarily and with the aid of computer-aided 
detection system. Sonography was also performed of the medial left breast. 
BREAST DENSITY: (Level B) There are scattered areas of fibroglandular density.

[L CC]
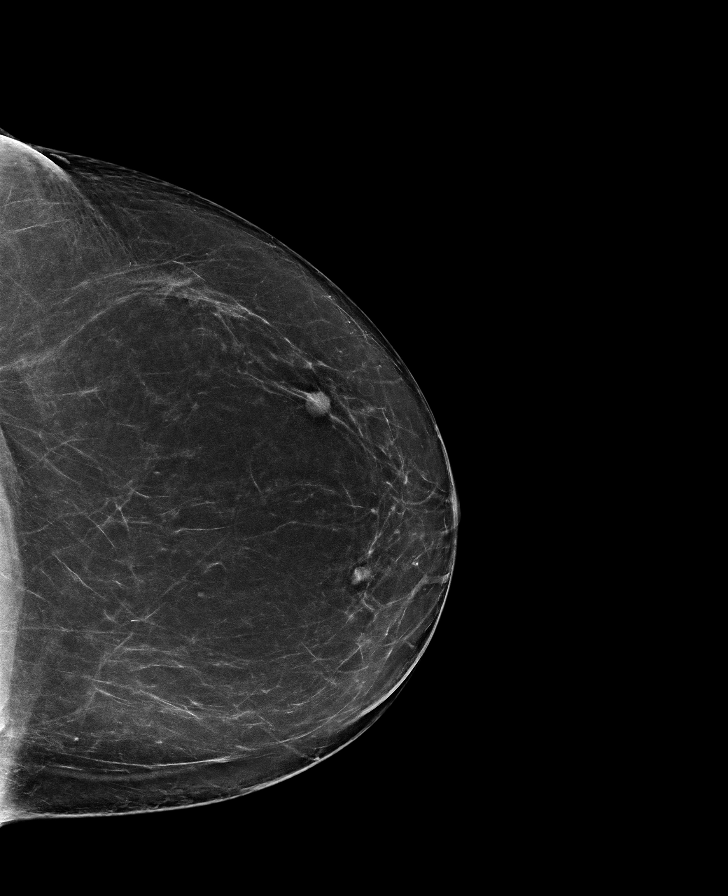

[R MLO]
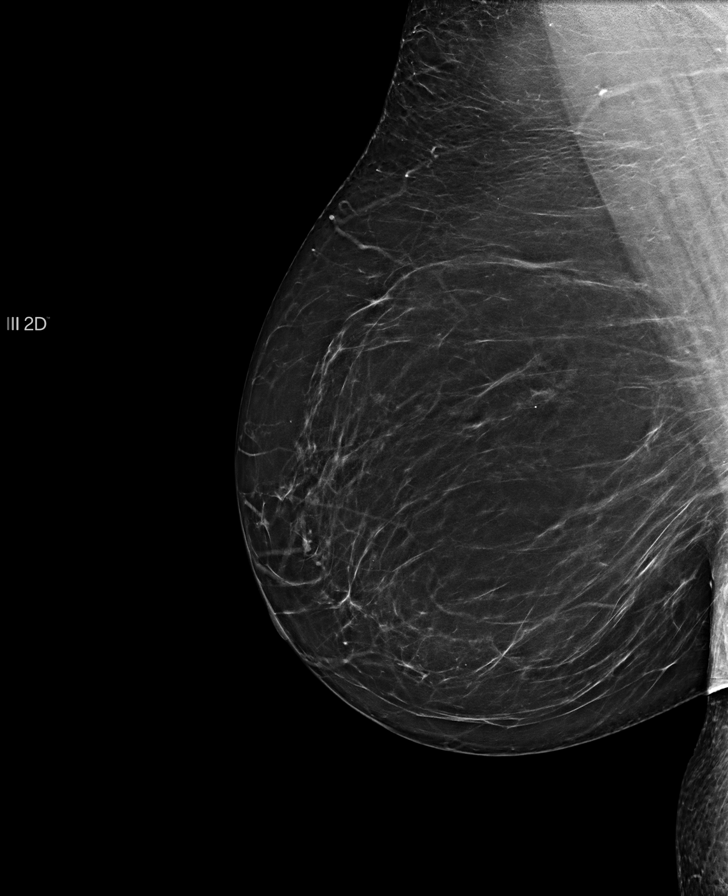

[L MLO]
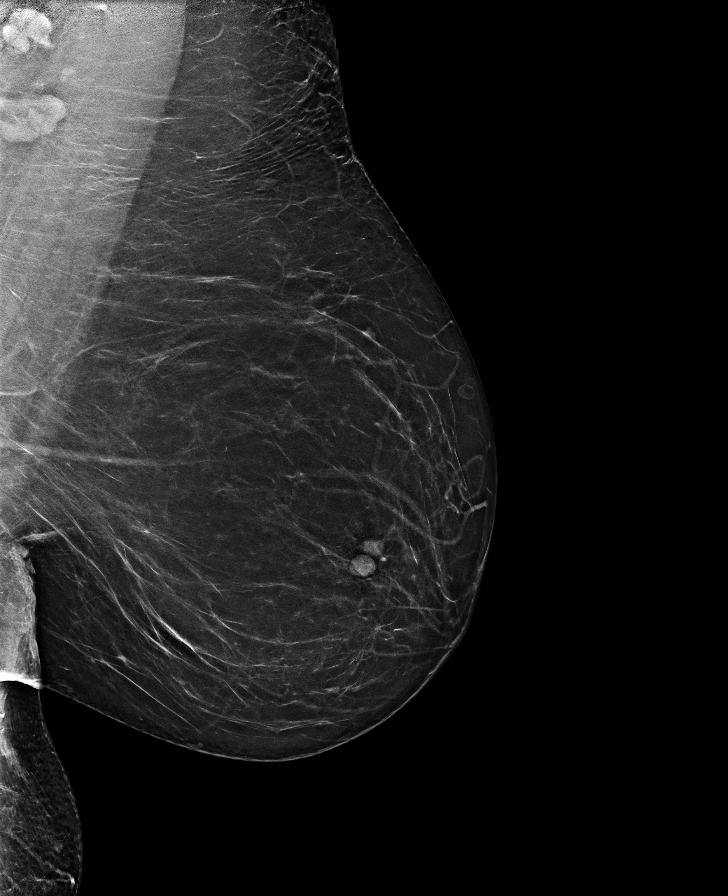

[R CC]
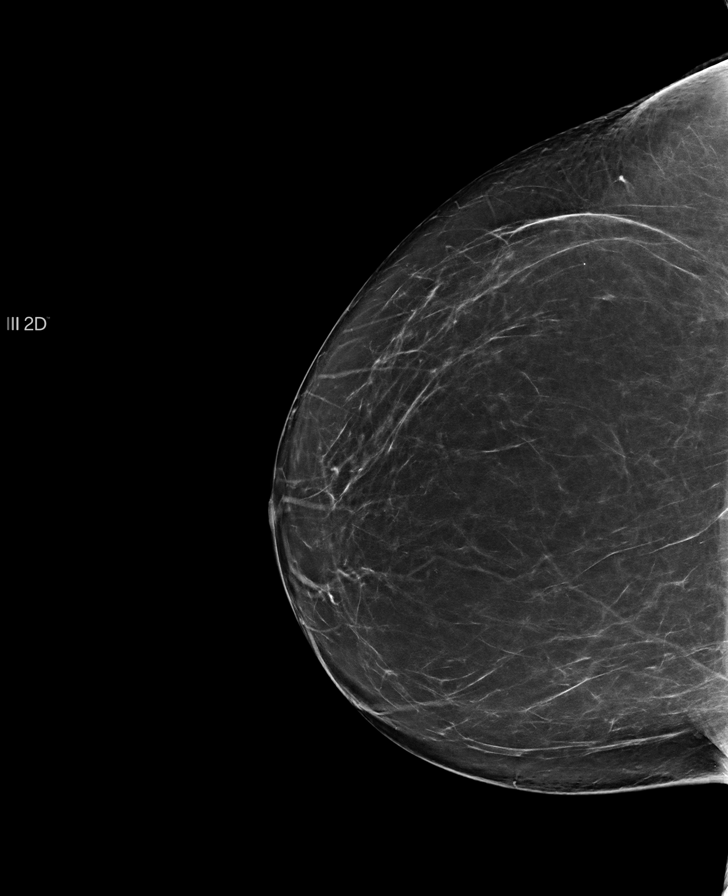

[L MLO tomo · tomo slice 45/89.0]
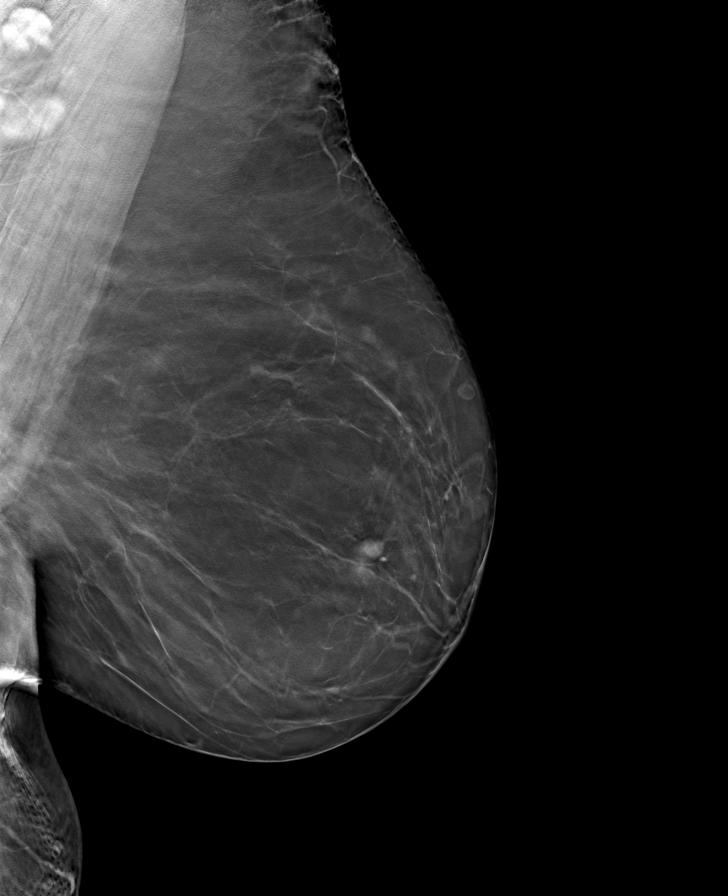

[R MLO tomo · tomo slice 47/94.0]
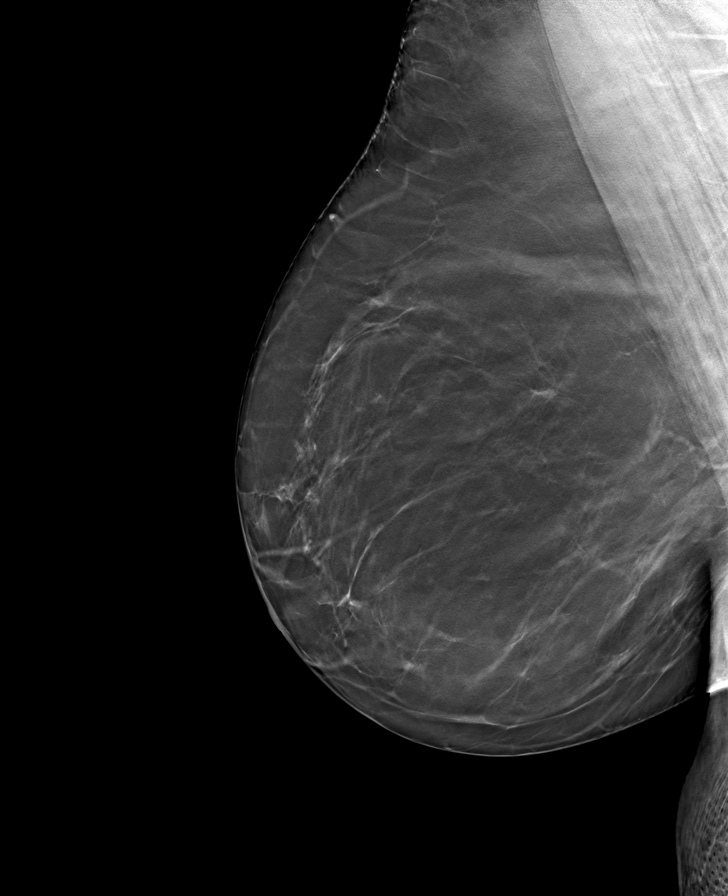

[L CC tomo · tomo slice 43/84.0]
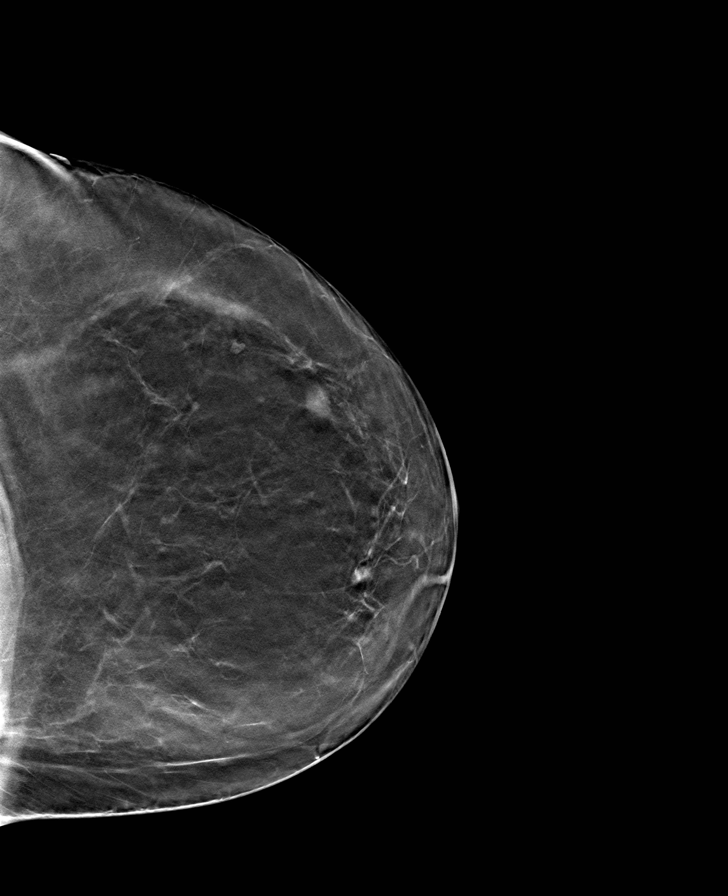

[R CC tomo · tomo slice 44/87.0]
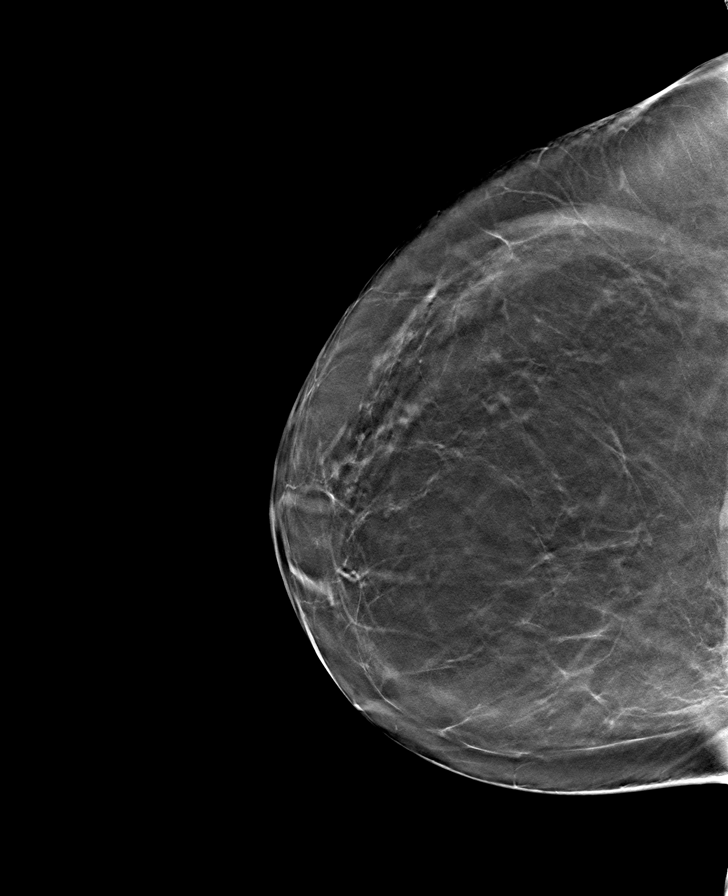

[8 of 24 positions shown; findings below may reference images not displayed]

FINDINGS: The previously described suspected cyst laterally within the left 
breast is unchanged. The slightly irregular cyst described medially in the left 
breast appears to slightly increase on mammography. No new abnormality seen 
elsewhere. 
Sonography shows slightly enlarging slightly complex cyst medially within the 
left breast. Back in July 2021 measured 6 x 4 x 4 mm, currently at 6 x 5 x 4 
mm.
IMPRESSION: Benign findings. 
(BI-RADS 2) Benign findings. Routine mammographic follow-up is recommended.

## 2023-05-09 IMAGING — MR MRI BRAIN W/WO CONTRAST
12 of 16 series · 31 of 48 positions shown · IV contrast (gadolinium)
Comparison: None.

________________________________________________________________________________________________ 
MRI ORBITS FACE OR NECK  W/WO CONTRAST, MRI BRAIN W/WO CONTRAST, 05/09/2023 [DATE]: 
CLINICAL INDICATION: Visual changes. Evaluate extraocular muscle enlargement. 
Thyrotoxicosis
TECHNIQUE: Multiplanar, multiecho position MR images of the brain and orbits 
were performed without and with intravenous gadolinium enhancement. 7.5 mL of 
were injected intravenously by hand. Patient was scanned on a 3T magnet.

[Series 101: survey · axial · 10.0mm · 0.98mm/px · 1 of 5 slices shown]
[im 1/5]
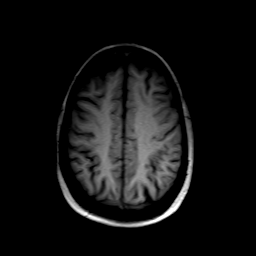

[Series 203: dadc map · axial · 4.0mm · 1.07mm/px · 1 of 28 slices shown (1 of 2)]
[im 1/28]
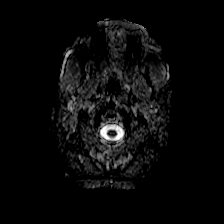

[Series 204: isob (id) · axial · 4.0mm · 1.07mm/px · 1 of 30 slices shown (1 of 2)]
[im 1/30]
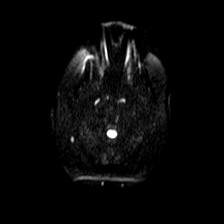

[Series 303: dadc map · coronal · 4.0mm · 0.81mm/px · 1 of 36 slices shown (2 of 2)]
[im 1/36]
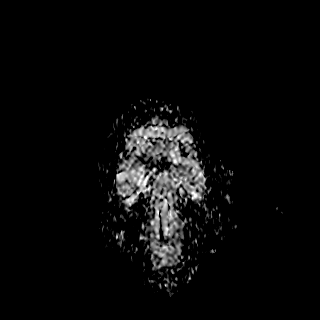

[Series 304: isob (id) · coronal · 4.0mm · 0.81mm/px · 2 of 36 slices shown (2 of 2)]
[im 1/36]
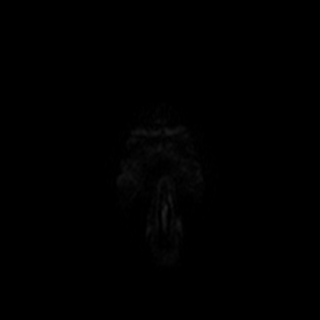
[im 36/36]
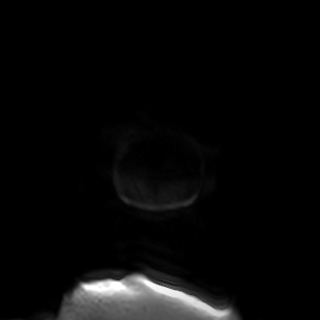

[Series 401: t1_se_sag · sagittal · 4.0mm · 0.42mm/px · 1 of 27 slices shown]
[im 1/27]
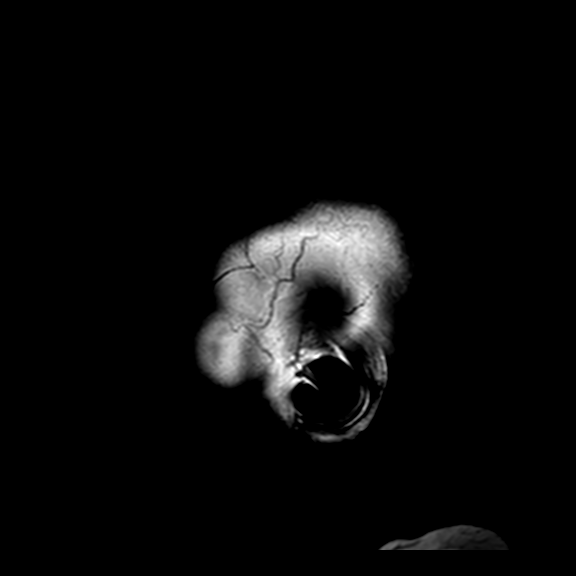

[Series 501: FLAIR fat-sat · axial · 5.0mm · 0.60mm/px · z∈[-117,+44]mm · 2 of 28 slices shown]
[im 1/28]
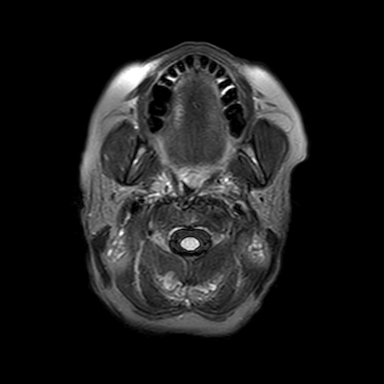
[im 28/28]
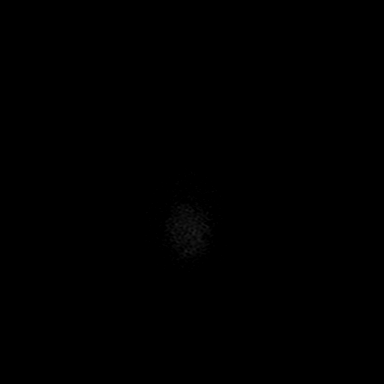

[Series 601: SWI · axial · 3.0mm · 0.53mm/px · z∈[-121,+26]mm · 6 of 100 slices shown (1 of 2)]
[im 1/100]
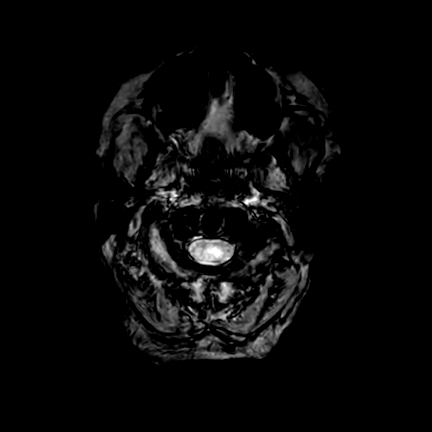
[im 20/100]
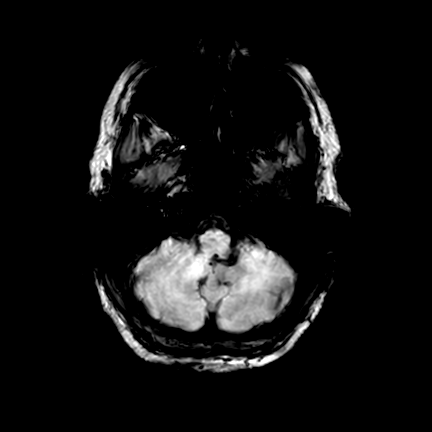
[im 40/100]
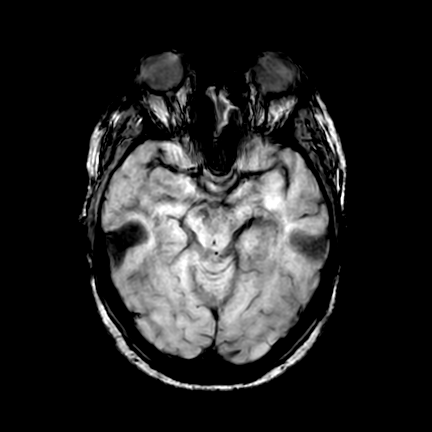
[im 60/100]
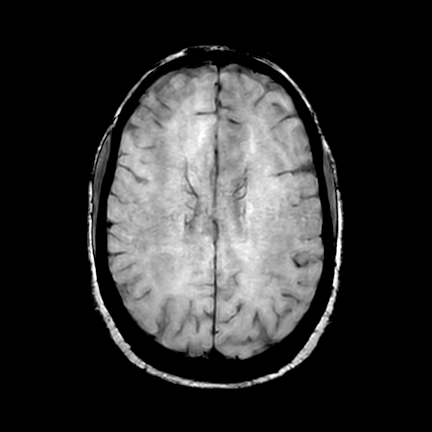
[im 80/100]
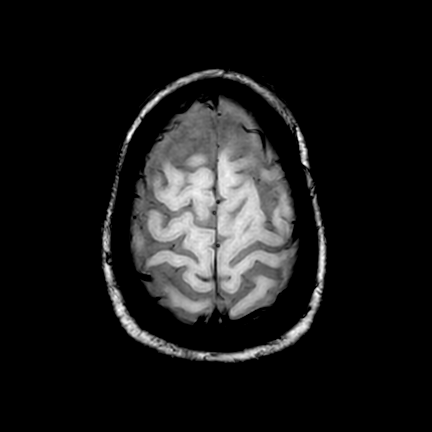
[im 100/100]
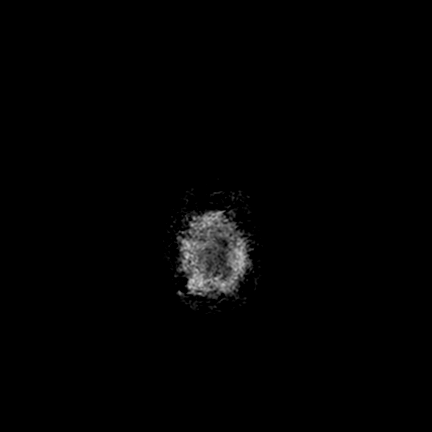

[Series 602: SWI · axial · 10.0mm · 0.53mm/px · z∈[-122,+31]mm · 4 of 78 slices shown (2 of 2)]
[im 1/78]
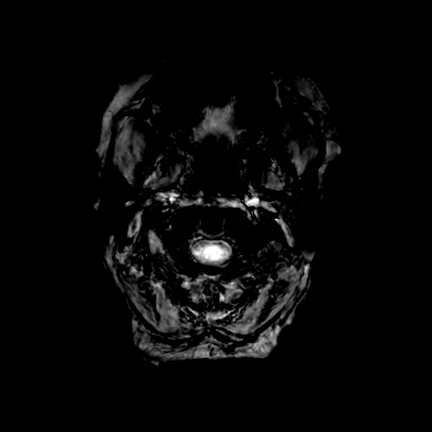
[im 26/78]
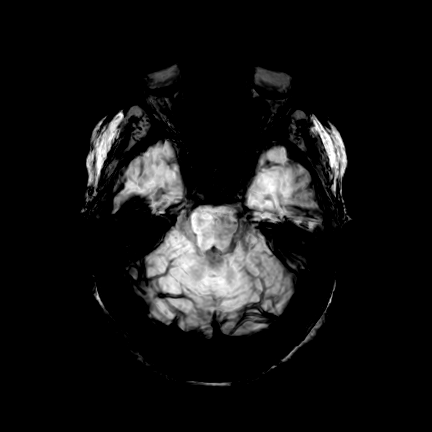
[im 52/78]
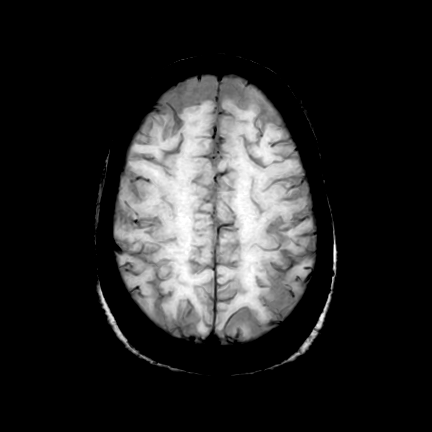
[im 78/78]
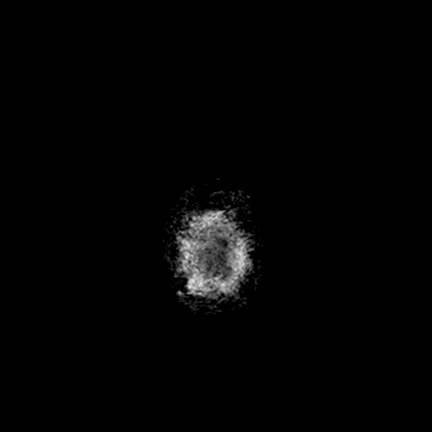

[Series 1702: T1 post-contrast · axial · 1.5mm · 0.67mm/px · z∈[-114,+49]mm · 6 of 110 slices shown (1 of 2)]
[im 1/110]
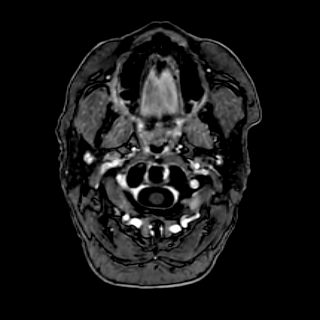
[im 22/110]
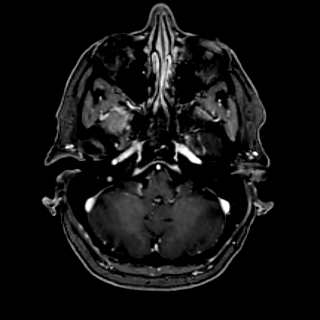
[im 44/110]
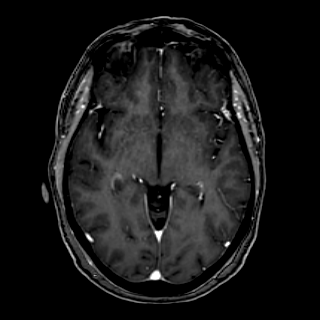
[im 66/110]
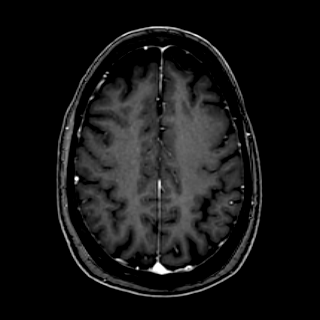
[im 88/110]
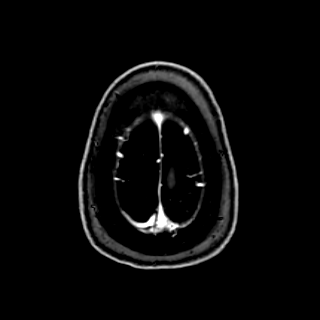
[im 110/110]
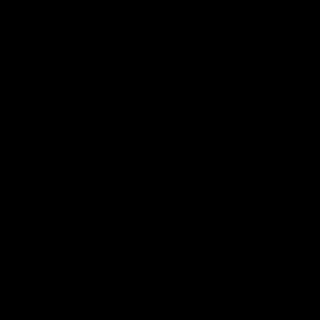

[Series 1703: T1 post-contrast · coronal · 1.5mm · 0.58mm/px · 4 of 75 slices shown (2 of 2)]
[im 1/75]
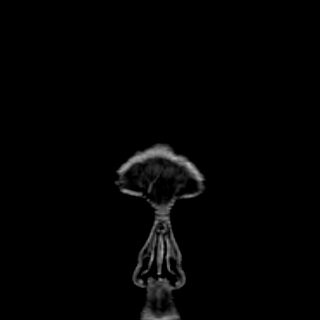
[im 25/75]
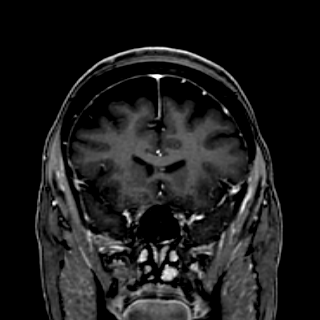
[im 50/75]
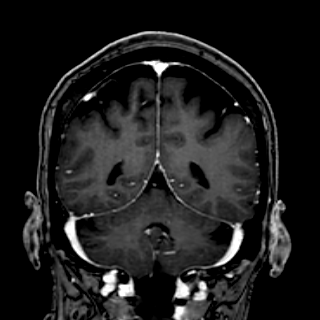
[im 75/75]
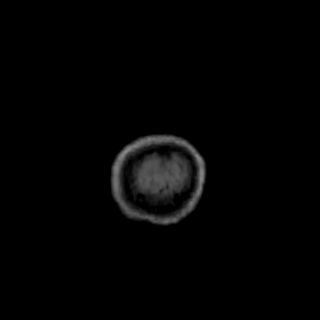

[Series 1801: T1 fat-sat post-contrast · axial · 5.0mm · 0.48mm/px · z∈[-125,+36]mm · 2 of 28 slices shown]
[im 1/28]
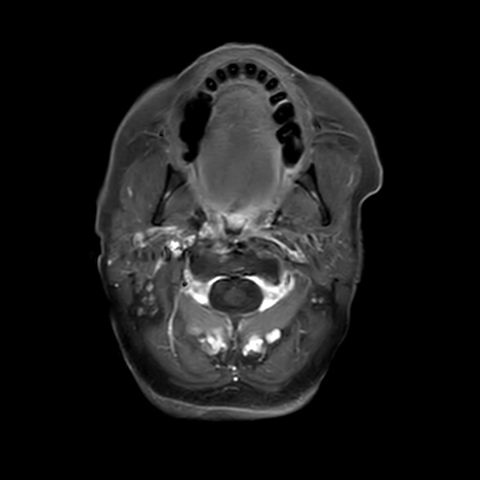
[im 28/28]
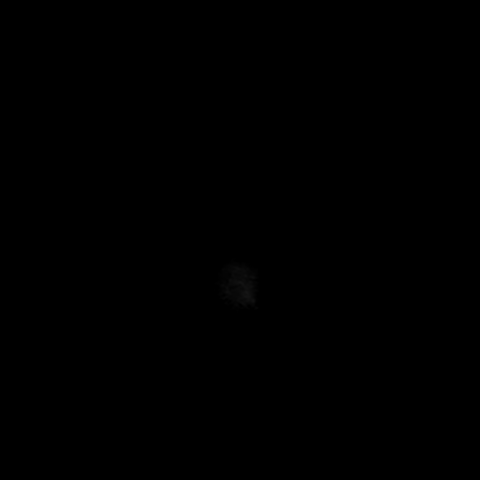

[31 of 48 positions shown; findings below may reference images not displayed]

FINDINGS: -------------------------------------------------------------------------------- 
---- 
ORBITS:  
Imaging of the orbits demonstrates intact globes. Symmetrical extraocular 
musculature without thickening/enlargement. The intraorbital, canalicular and 
prechiasmatic optic nerve segments are normal in size and signal intensity 
bilaterally. The optic chiasm and proximal optic tracts are unremarkable. No 
localized inflammation of the intraconal or extraconal orbital fat. Normal 
lacrimal glands. No intraorbital mass or pathologic intraorbital enhancement. 
Right extraocular muscle measurements: 
Inferior rectus: 5 mm 
Medial rectus: 4 mm 
Superior rectus: 4 mm 
Lateral rectus: 4 mm 
Left extraocular muscle measurements: 
Inferior rectus: 5 mm 
Medial rectus: 4 mm 
Superior rectus: 4 mm 
Lateral rectus: 4 mm 
Mild proptosis bilaterally with the posterior sclera 0.4 cm behind the 
interzygomatic line. Normal distance is 0.9 cm (range between 0.6 and 1.3 cm).  

-------------------------------------------------------------------------------- 
--- 
INTRACRANIAL:  
No acute ischemia. No abnormal foci of susceptibility artifact in the brain. 
Patency of intracranial vascular flow voids. Periventricular and deep white 
matter change, probably secondary to microangiopathy. This is mild. No acute 
hemorrhage, midline shift, mass effect. No pathologic enhancement in the brain. 
-------------------------------------------------------------------------------- 
-- 
OTHER: 
SINUSES/T-BONES:  Mastoid air cells and middle ear cavities are grossly clear.  
Mucosal thickening in the ethmoidal air cells, moderate on the left side. 
Mucosal thickening in the left sphenoid sinus anteriorly and in the left frontal 
sinus, mild. 
MARROW SIGNAL/SOFT TISSUES: Retropharyngeal course of the carotid arteries. Left 
temporomandibular joint degenerative changes. 
-------------------------------------------------------------------------------- 
-
IMPRESSION: 1.  Mild bilateral proptosis. 
2.  No extraocular muscle enlargement on either side. 
3.  No acute abnormality, mass, pathologic enhancement within the orbits or in 
the brain. 
4.  Mild white matter microangiopathic change. 
5.  Moderate mucosal thickening in the left ethmoidal air cells.

## 2023-11-15 IMAGING — DX CHEST PA AND LATERAL
2 series · 2 of 2 positions shown · non-contrast
Comparison: 07/05/2022

________________________________________________________________________________________________ 
CLINICAL INDICATION: Cough, Unspecified.

[PA]
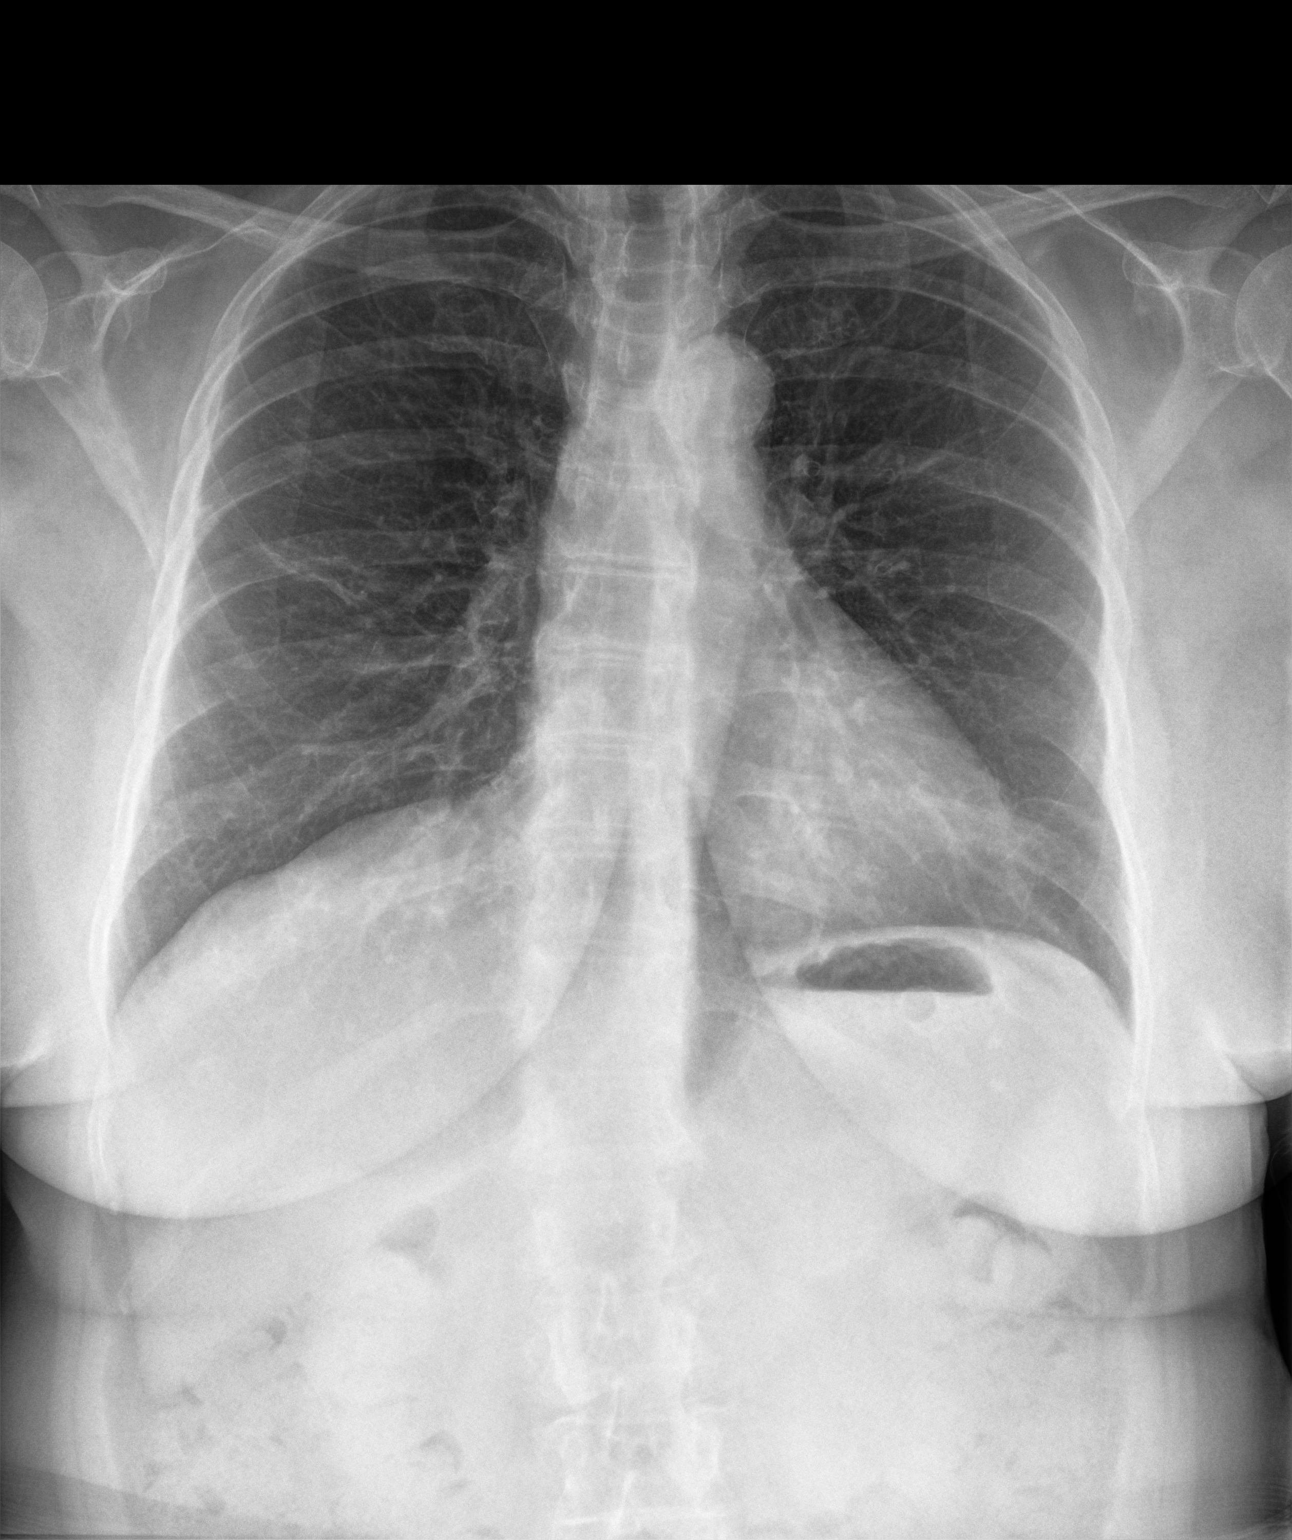

[lateral]
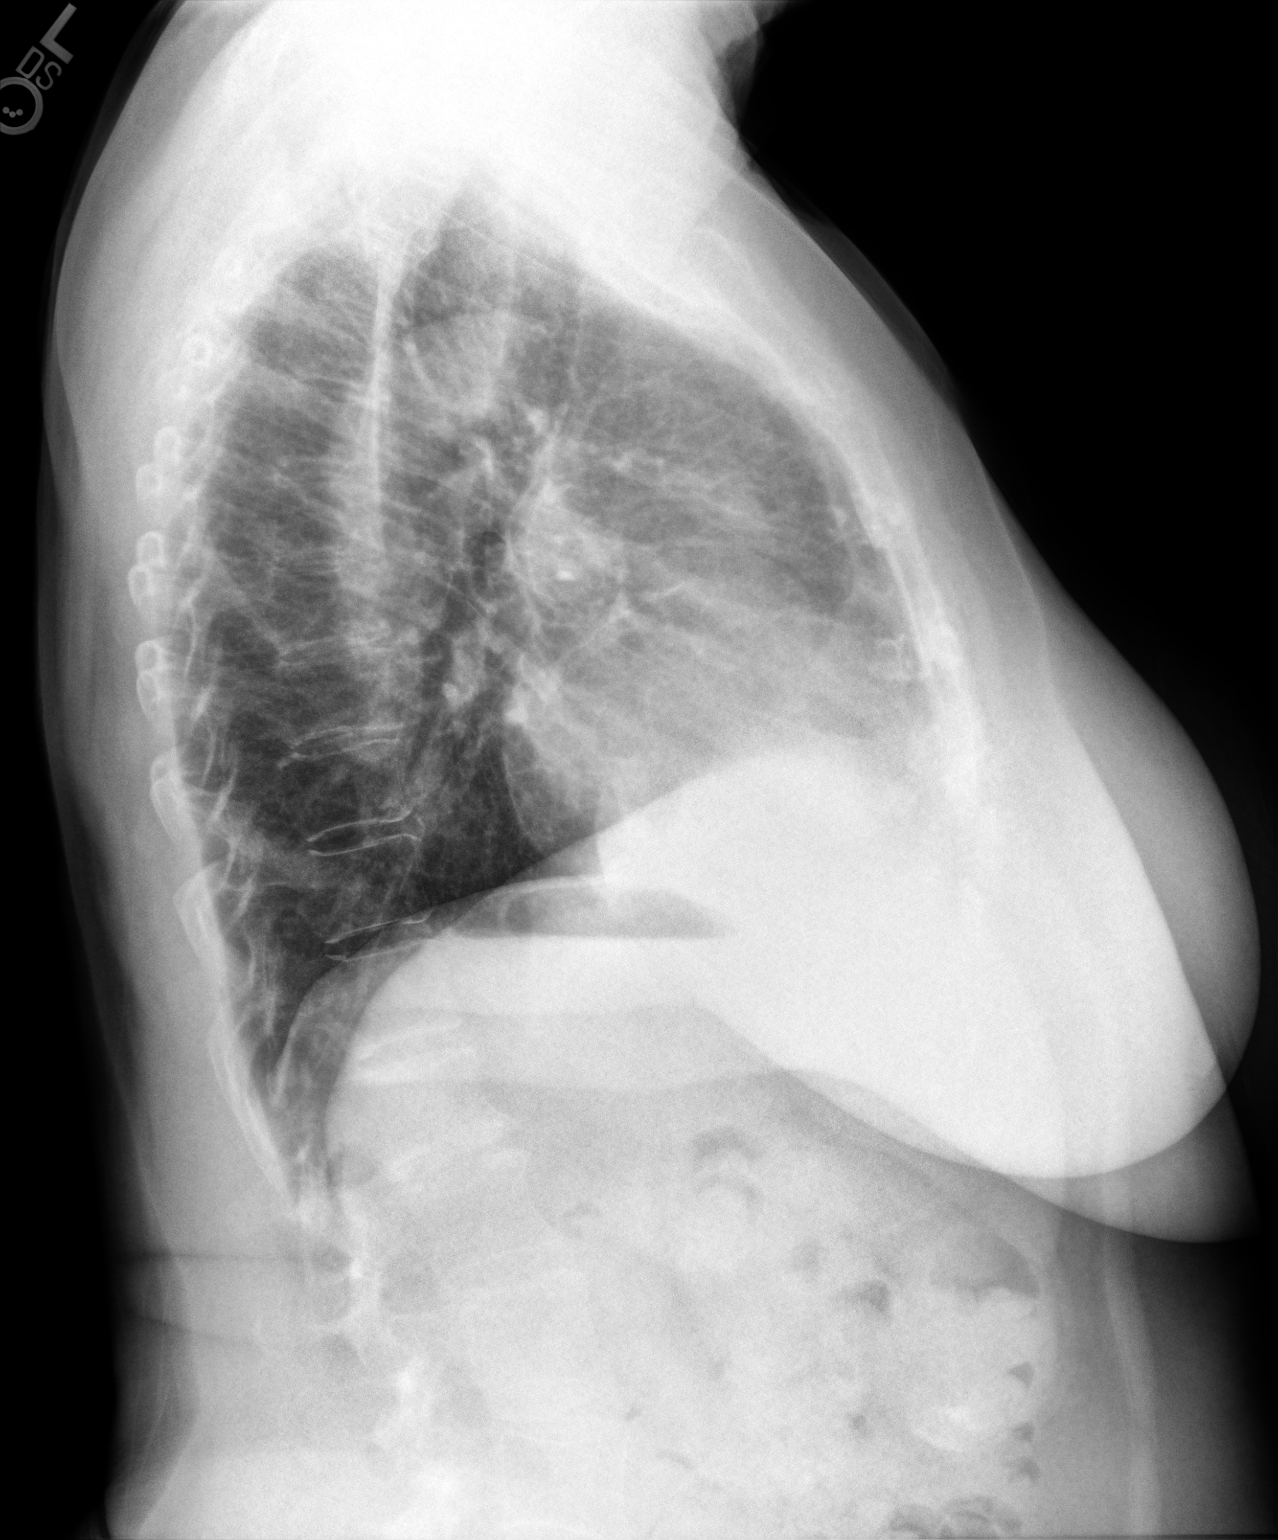

[2 of 2 positions shown; findings below may reference images not displayed]

FINDINGS: Lungs are clear. No consolidation. No effusion. Normal cardiac size 
and pulmonary vascularity. No pneumothorax. No acute osseous abnormality.
IMPRESSION: No acute cardiopulmonary findings.

## 2023-12-19 IMAGING — MG MAMMOGRAPHY SCREENING BILATERAL 3[PERSON_NAME]
8 series · 8 of 24 positions shown · non-contrast
Comparison: Comparison was made to prior examinations.

________________________________________________________________________________________________ 
MAMMOGRAPHY SCREENING BILATERAL 3SORIN OXENDINE, 12/19/2023 [DATE]: 
CLINICAL INDICATION: Encounter for screening mammogram.
TECHNIQUE: Digital bilateral mammograms and 3-D Tomosynthesis were obtained. 
These were interpreted both primarily and with the aid of computer-aided 
detection system.  
BREAST DENSITY: (Level B) There are scattered areas of fibroglandular density.

[L MLO]
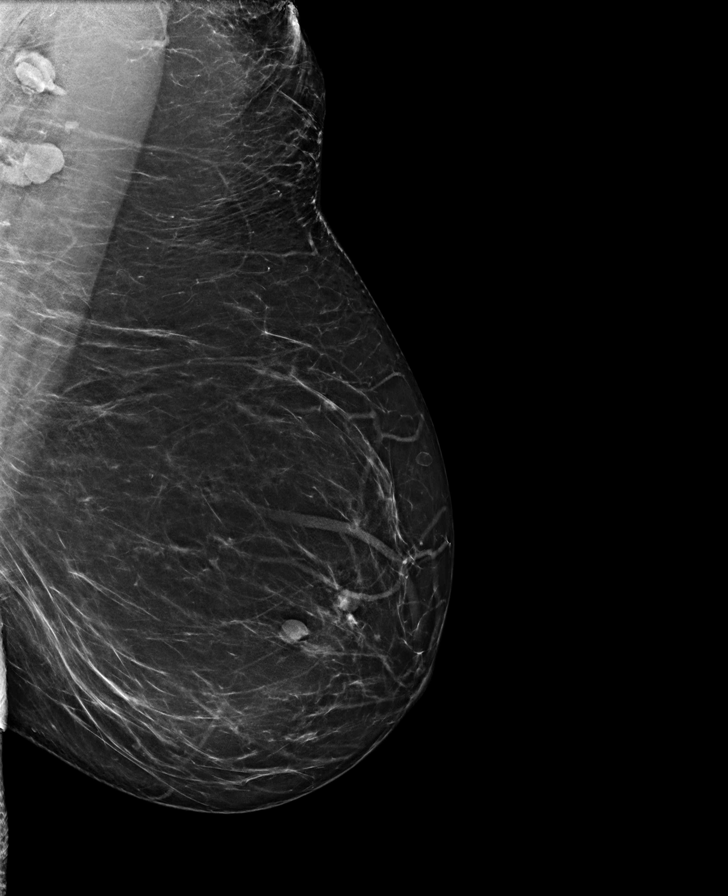

[R MLO]
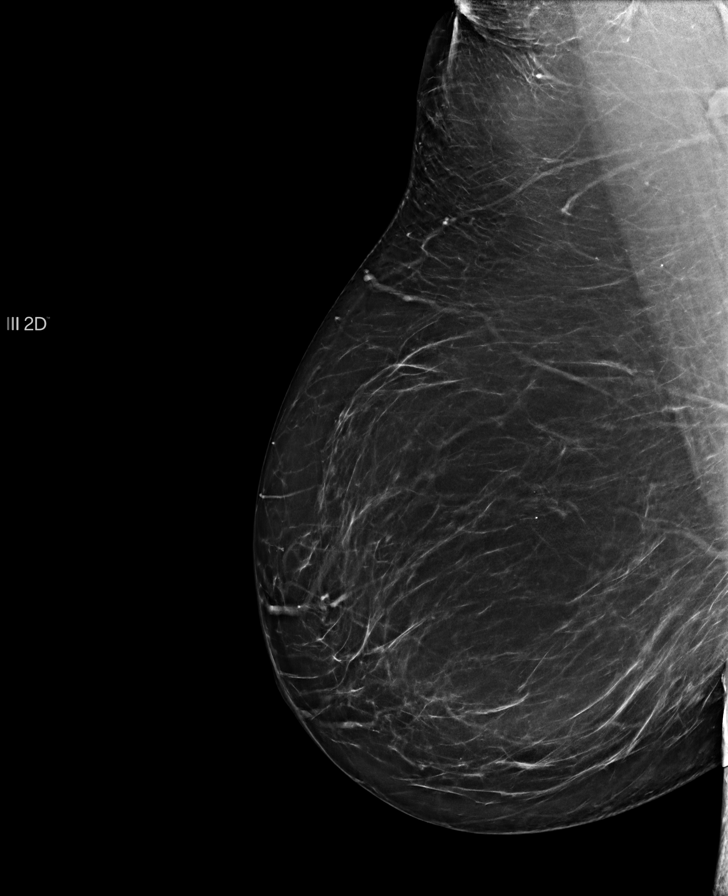

[L CC]
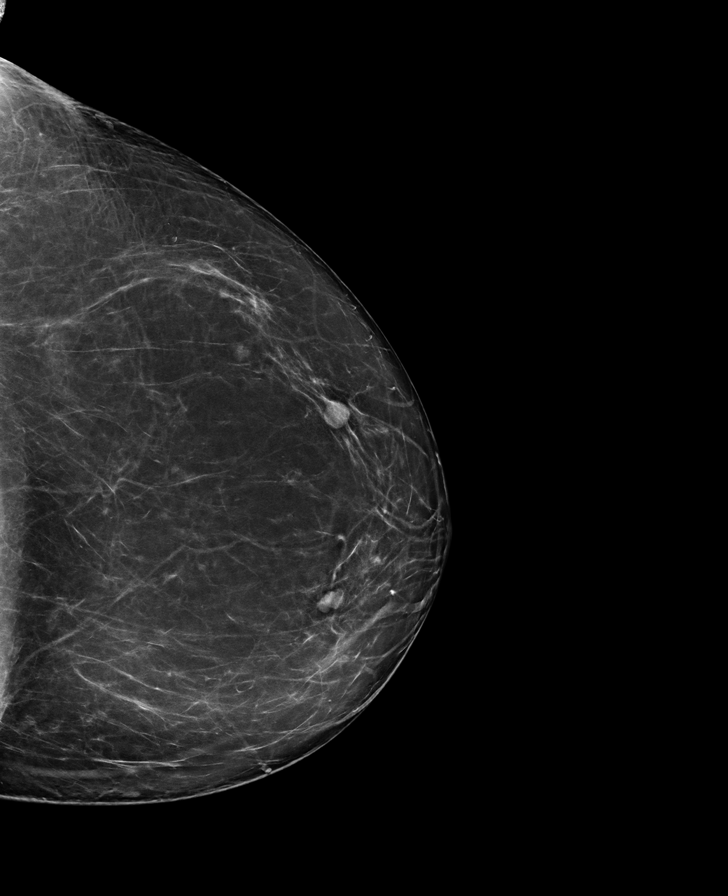

[R CC]
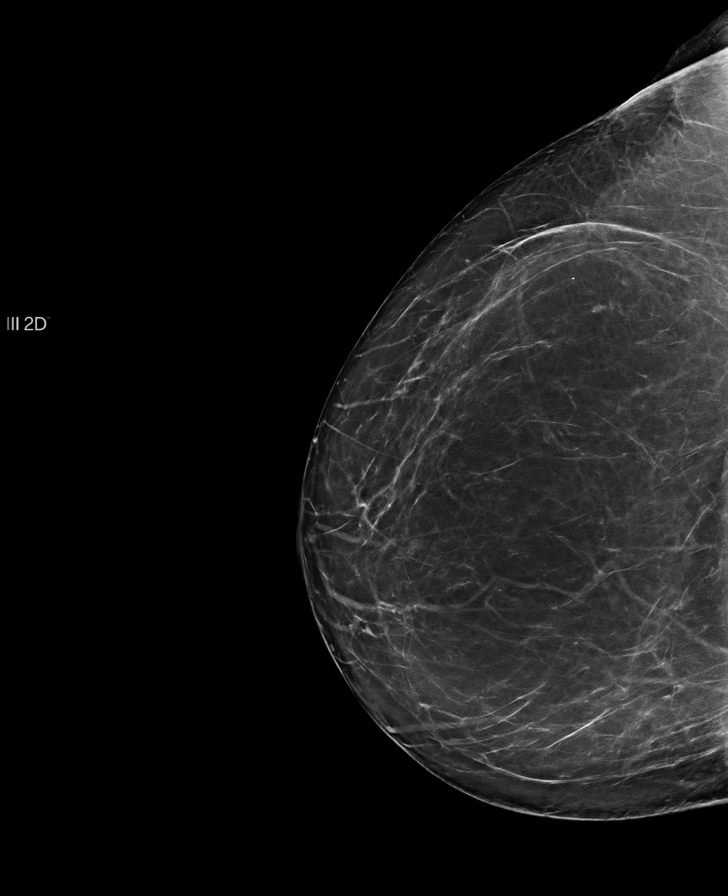

[L CC tomo · tomo slice 15/28.0]
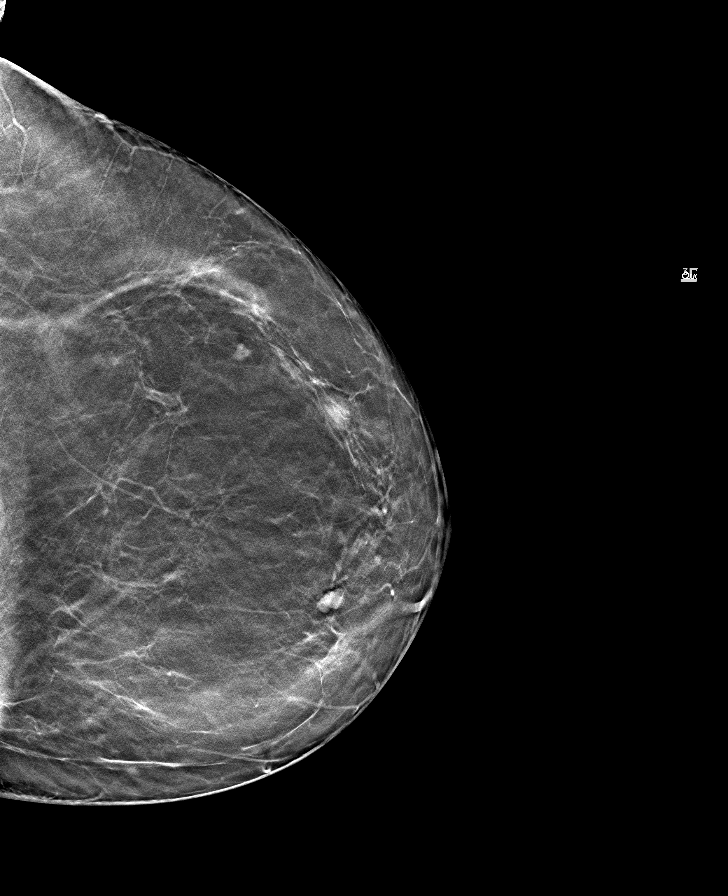

[R CC tomo · tomo slice 15/30.0]
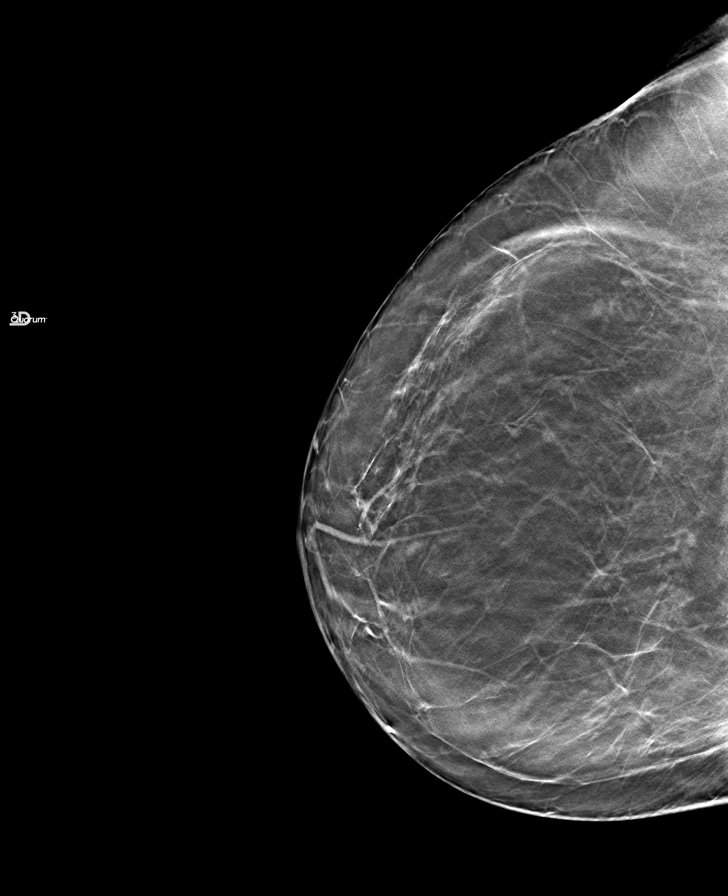

[L MLO tomo · tomo slice 16/31.0]
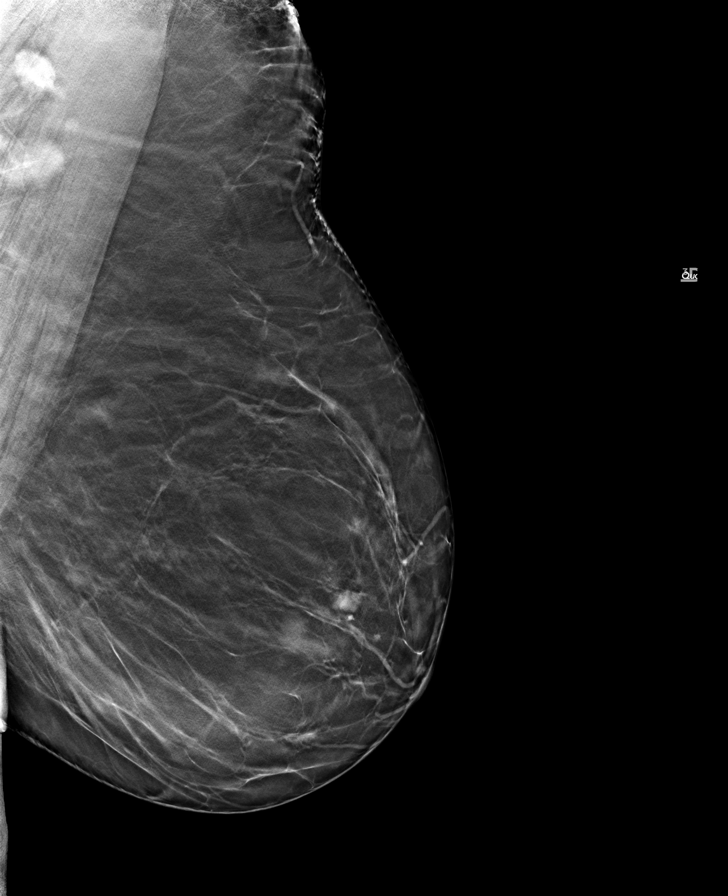

[R MLO tomo · tomo slice 17/32.0]
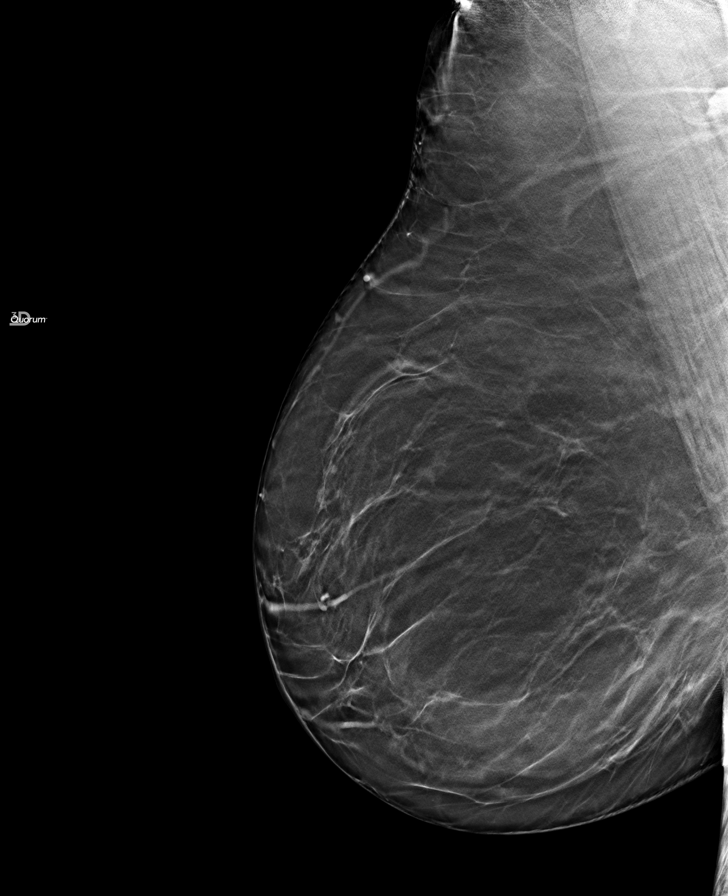

[8 of 24 positions shown; findings below may reference images not displayed]

FINDINGS: Isodense nodular densities left breast previously shown to represent 
cysts are stable. Scattered benign calcification. No new suspicious mass, 
calcifications, or area of architectural distortion in either breast.
IMPRESSION: Stable mammogram. 
(BI-RADS 2) Benign findings. Routine mammographic follow-up is recommended.
